# Patient Record
Sex: Male | Born: 1960
Health system: Southern US, Community
[De-identification: ages and names within clinical notes are randomized; demographics above are authoritative.]

## PROBLEM LIST (undated history)

## (undated) DIAGNOSIS — E669 Obesity, unspecified: Secondary | ICD-10-CM

## (undated) DIAGNOSIS — F1910 Other psychoactive substance abuse, uncomplicated: Secondary | ICD-10-CM

## (undated) DIAGNOSIS — E785 Hyperlipidemia, unspecified: Secondary | ICD-10-CM

## (undated) DIAGNOSIS — F1011 Alcohol abuse, in remission: Secondary | ICD-10-CM

## (undated) DIAGNOSIS — M109 Gout, unspecified: Secondary | ICD-10-CM

## (undated) DIAGNOSIS — E119 Type 2 diabetes mellitus without complications: Secondary | ICD-10-CM

## (undated) DIAGNOSIS — Z8489 Family history of other specified conditions: Secondary | ICD-10-CM

## (undated) DIAGNOSIS — Z87891 Personal history of nicotine dependence: Secondary | ICD-10-CM

## (undated) HISTORY — PX: COLONOSCOPY: SHX174

## (undated) HISTORY — DX: Type 2 diabetes mellitus without complications: E11.9

## (undated) HISTORY — DX: Obesity, unspecified: E66.9

## (undated) HISTORY — DX: Hyperlipidemia, unspecified: E78.5

## (undated) HISTORY — DX: Gout, unspecified: M10.9

## (undated) HISTORY — DX: Family history of other specified conditions: Z84.89

## (undated) HISTORY — PX: UMBILICAL HERNIA REPAIR: SHX196

## (undated) HISTORY — DX: Personal history of nicotine dependence: Z87.891

## (undated) HISTORY — DX: Alcohol abuse, in remission: F10.11

## (undated) HISTORY — DX: Other psychoactive substance abuse, uncomplicated: F19.10

---

## 2001-09-25 ENCOUNTER — Emergency Department (HOSPITAL_COMMUNITY): Admission: EM | Admit: 2001-09-25 | Discharge: 2001-09-25 | Payer: Self-pay | Admitting: Emergency Medicine

## 2001-12-19 ENCOUNTER — Emergency Department (HOSPITAL_COMMUNITY): Admission: EM | Admit: 2001-12-19 | Discharge: 2001-12-19 | Payer: Self-pay | Admitting: *Deleted

## 2004-04-22 ENCOUNTER — Emergency Department (HOSPITAL_COMMUNITY): Admission: EM | Admit: 2004-04-22 | Discharge: 2004-04-22 | Payer: Self-pay | Admitting: Emergency Medicine

## 2005-06-04 ENCOUNTER — Emergency Department (HOSPITAL_COMMUNITY): Admission: EM | Admit: 2005-06-04 | Discharge: 2005-06-04 | Payer: Self-pay | Admitting: Emergency Medicine

## 2005-08-05 ENCOUNTER — Emergency Department (HOSPITAL_COMMUNITY): Admission: EM | Admit: 2005-08-05 | Discharge: 2005-08-05 | Payer: Self-pay | Admitting: Emergency Medicine

## 2006-02-06 ENCOUNTER — Emergency Department (HOSPITAL_COMMUNITY): Admission: EM | Admit: 2006-02-06 | Discharge: 2006-02-06 | Payer: Self-pay | Admitting: Emergency Medicine

## 2006-02-15 ENCOUNTER — Emergency Department (HOSPITAL_COMMUNITY): Admission: EM | Admit: 2006-02-15 | Discharge: 2006-02-15 | Payer: Self-pay | Admitting: Emergency Medicine

## 2006-11-12 ENCOUNTER — Emergency Department (HOSPITAL_COMMUNITY): Admission: EM | Admit: 2006-11-12 | Discharge: 2006-11-12 | Payer: Self-pay | Admitting: Emergency Medicine

## 2007-06-11 ENCOUNTER — Emergency Department (HOSPITAL_COMMUNITY): Admission: EM | Admit: 2007-06-11 | Discharge: 2007-06-11 | Payer: Self-pay | Admitting: Emergency Medicine

## 2007-08-26 IMAGING — CR DG WRIST COMPLETE 3+V*R*
2 series · 2 of 2 positions shown · non-contrast
Comparison: none

HISTORY: Pain right foot, right hand, neck

RIGHT WRIST 4 VIEWS:
No fracture, dislocation, or bone destruction.
Joint spaces preserved.  
Mineralization normal.
Minimal soft tissue swelling dorsal to distal radial and ulnar metaphyses,
nonspecific.

[view not recorded (1 of 2)]
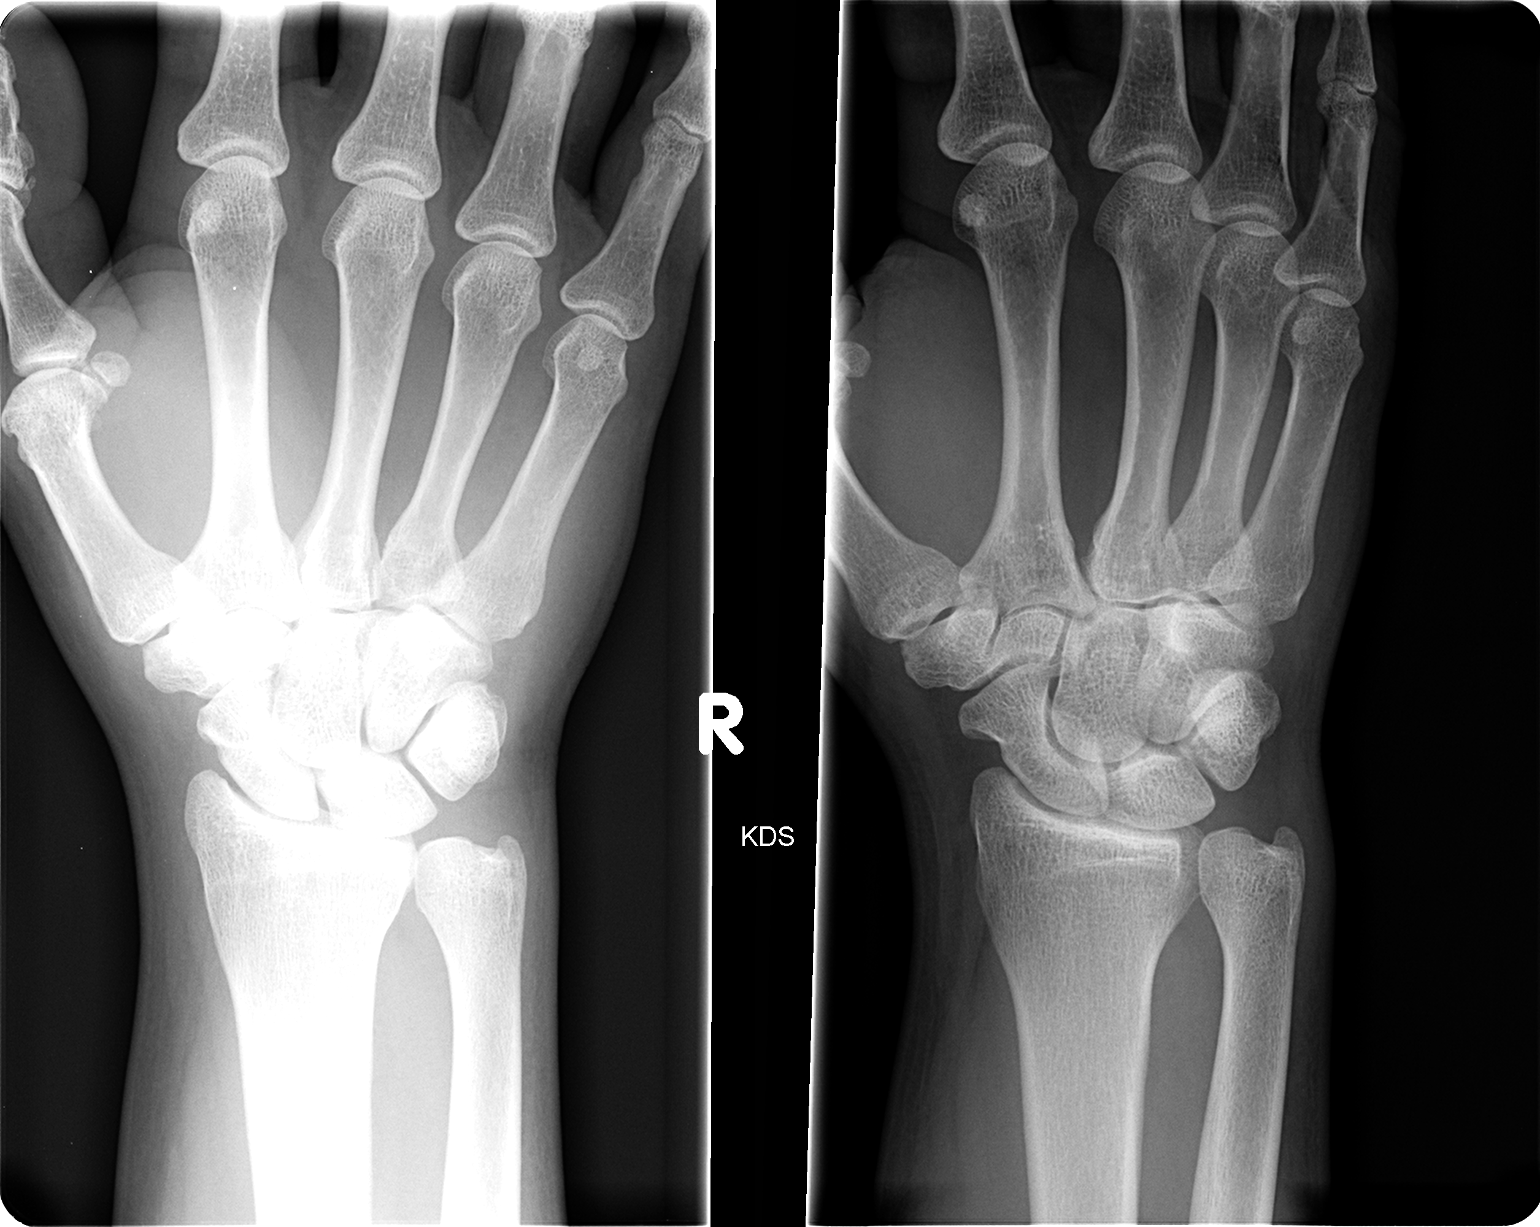

[view not recorded (2 of 2)]
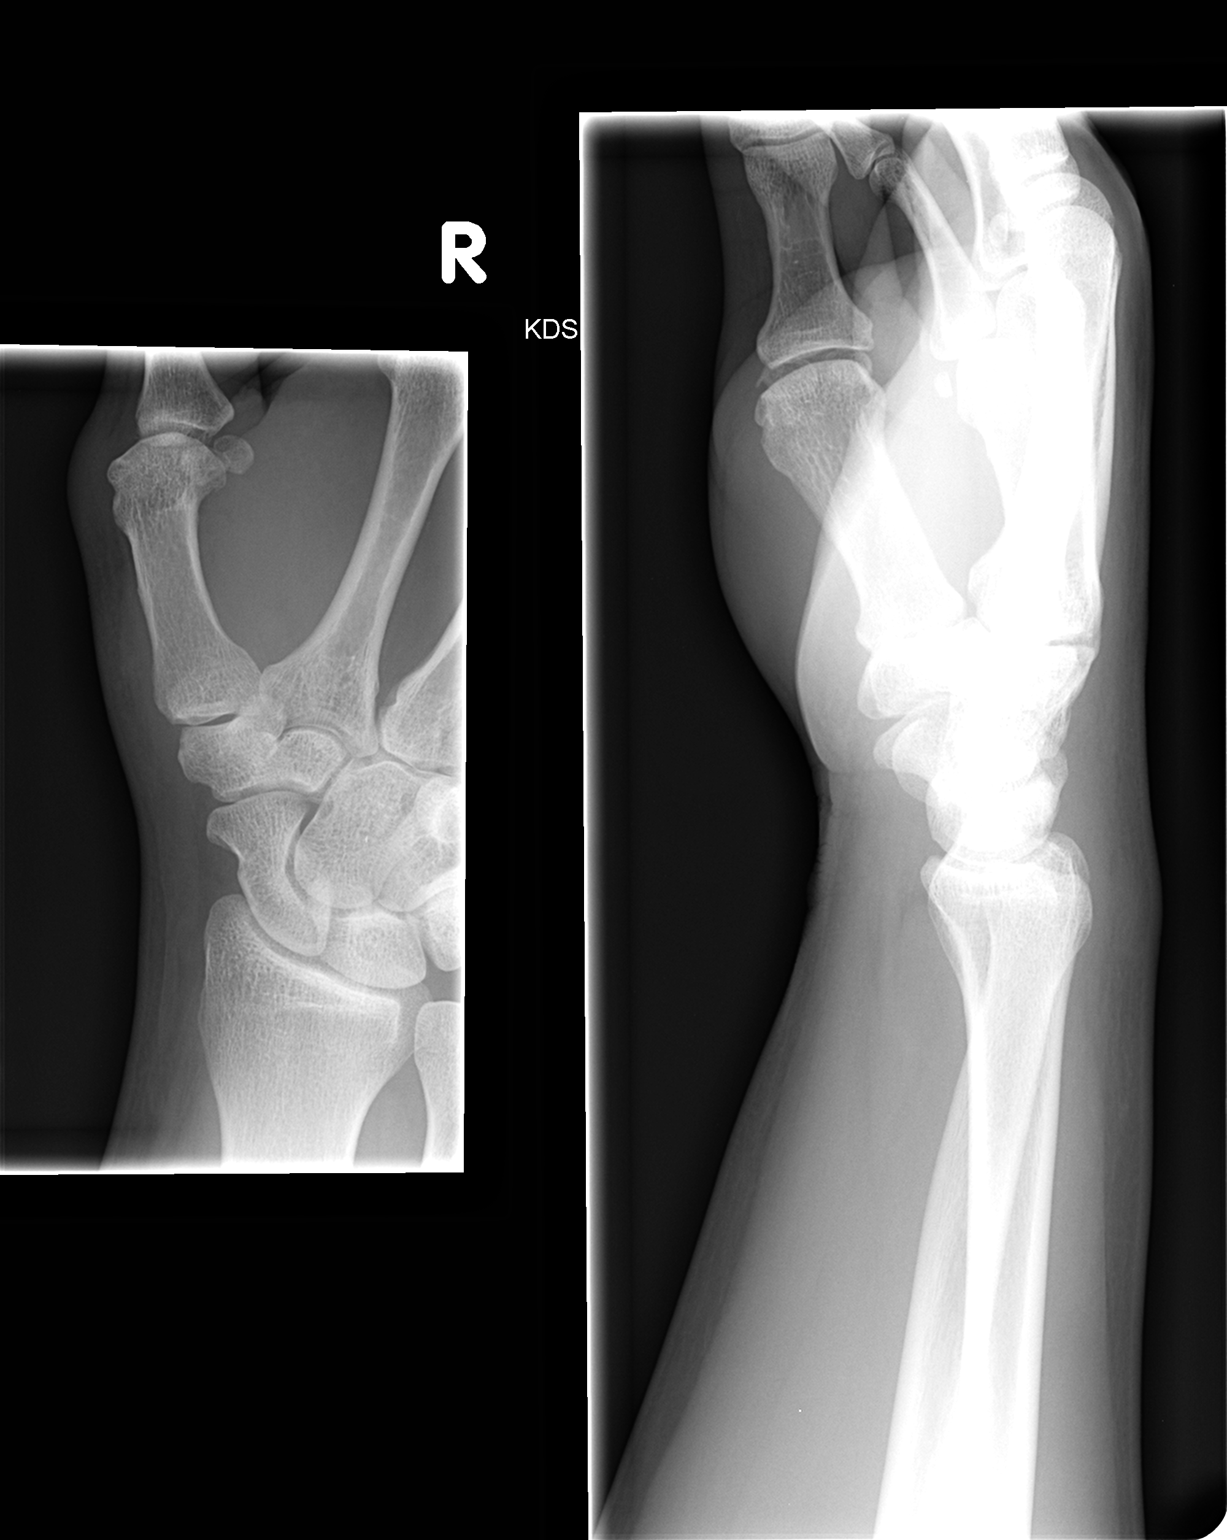

[2 of 2 positions shown; findings below may reference images not displayed]

IMPRESSION: No acute bony abnormalities.
Minimal dorsal soft tissue swelling at wrist.

RIGHT HAND 3 VIEWS:

Tiny ossicle at radial margin of first MCP joint.
No fracture, dislocation, or bone destruction.
Mineralization normal.
Question minimal soft tissue swelling at dorsal margin of hand at fifth MCP
joint.
IMPRESSION: No acute bony abnormalities.
Question minimal soft tissue swelling overlying 5th MCP joint.

RIGHT FOOT 3 VIEWS:

Mild degenerative changes first MTP joint with joint space narrowing.
Remaining joint spaces preserved.
Mineralization normal.
No fracture, dislocation, or bone destruction.
IMPRESSION: Degenerative changes right first MTP joint.

## 2007-08-26 IMAGING — CR DG HAND COMPLETE 3+V*R*
3 series · 3 of 3 positions shown · non-contrast
Comparison: none

HISTORY: Pain right foot, right hand, neck

RIGHT WRIST 4 VIEWS:
No fracture, dislocation, or bone destruction.
Joint spaces preserved.  
Mineralization normal.
Minimal soft tissue swelling dorsal to distal radial and ulnar metaphyses,
nonspecific.

[view not recorded (1 of 3)]
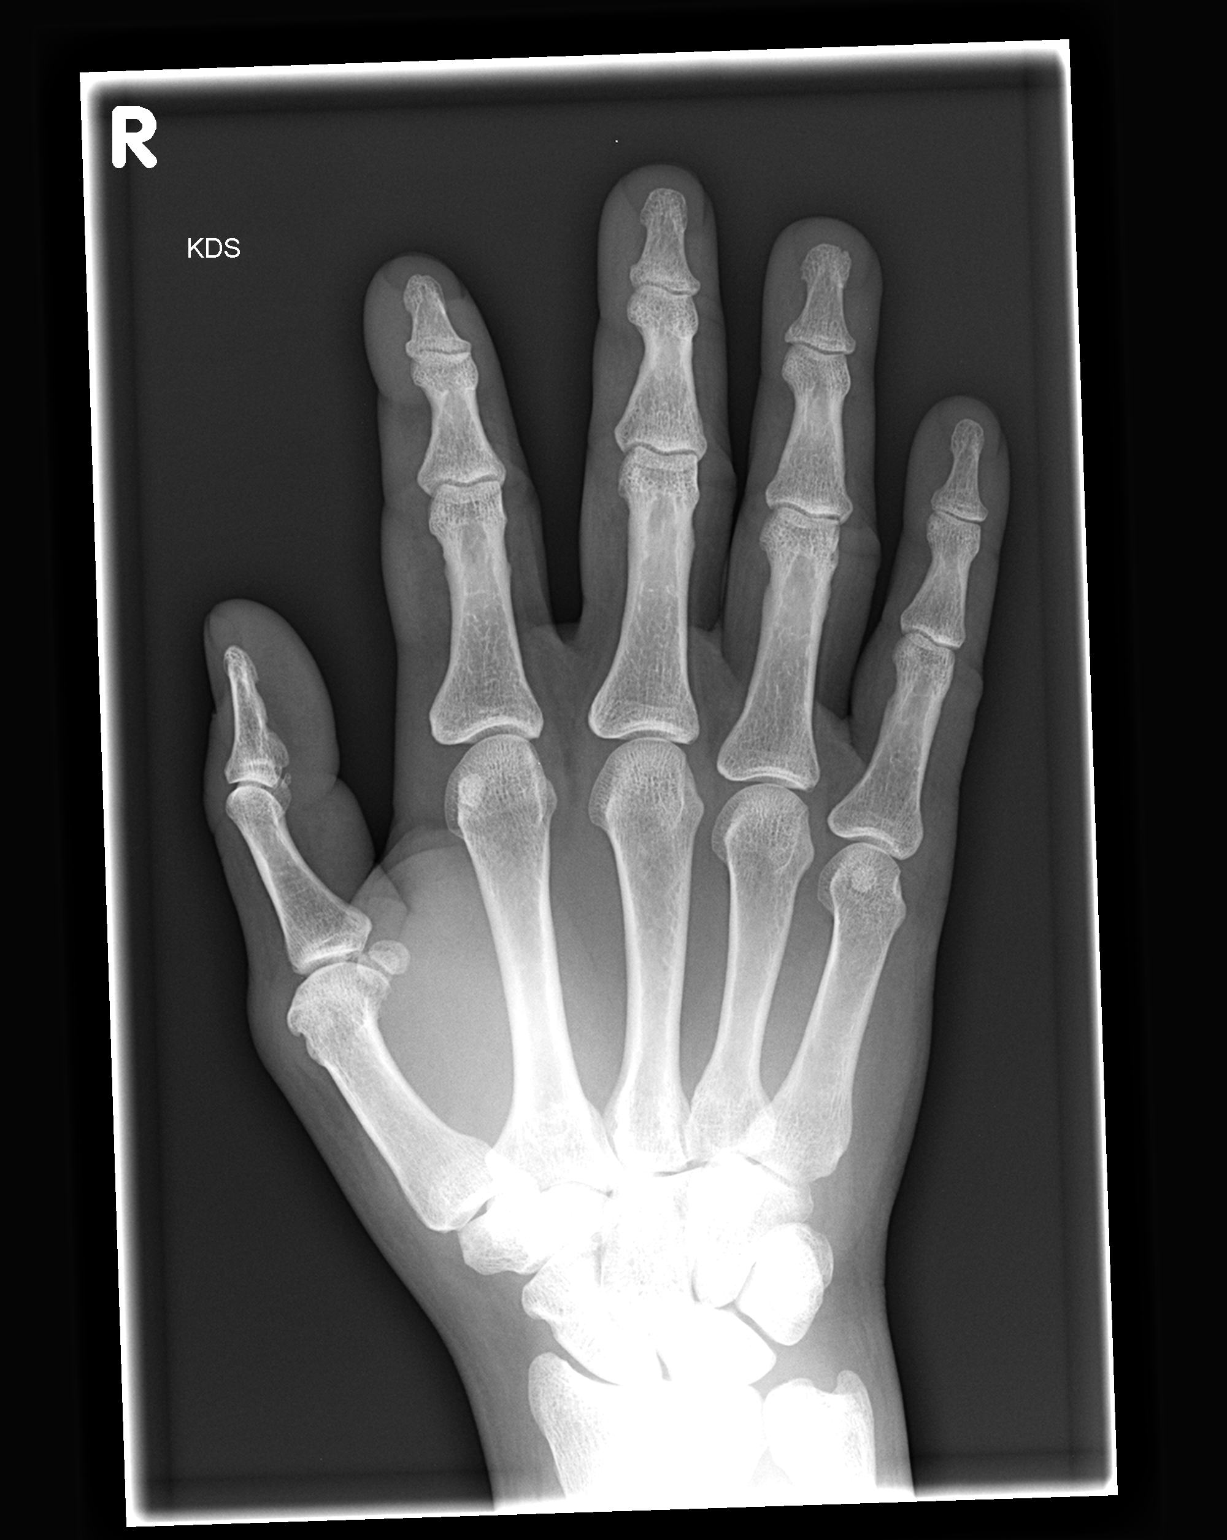

[view not recorded (2 of 3)]
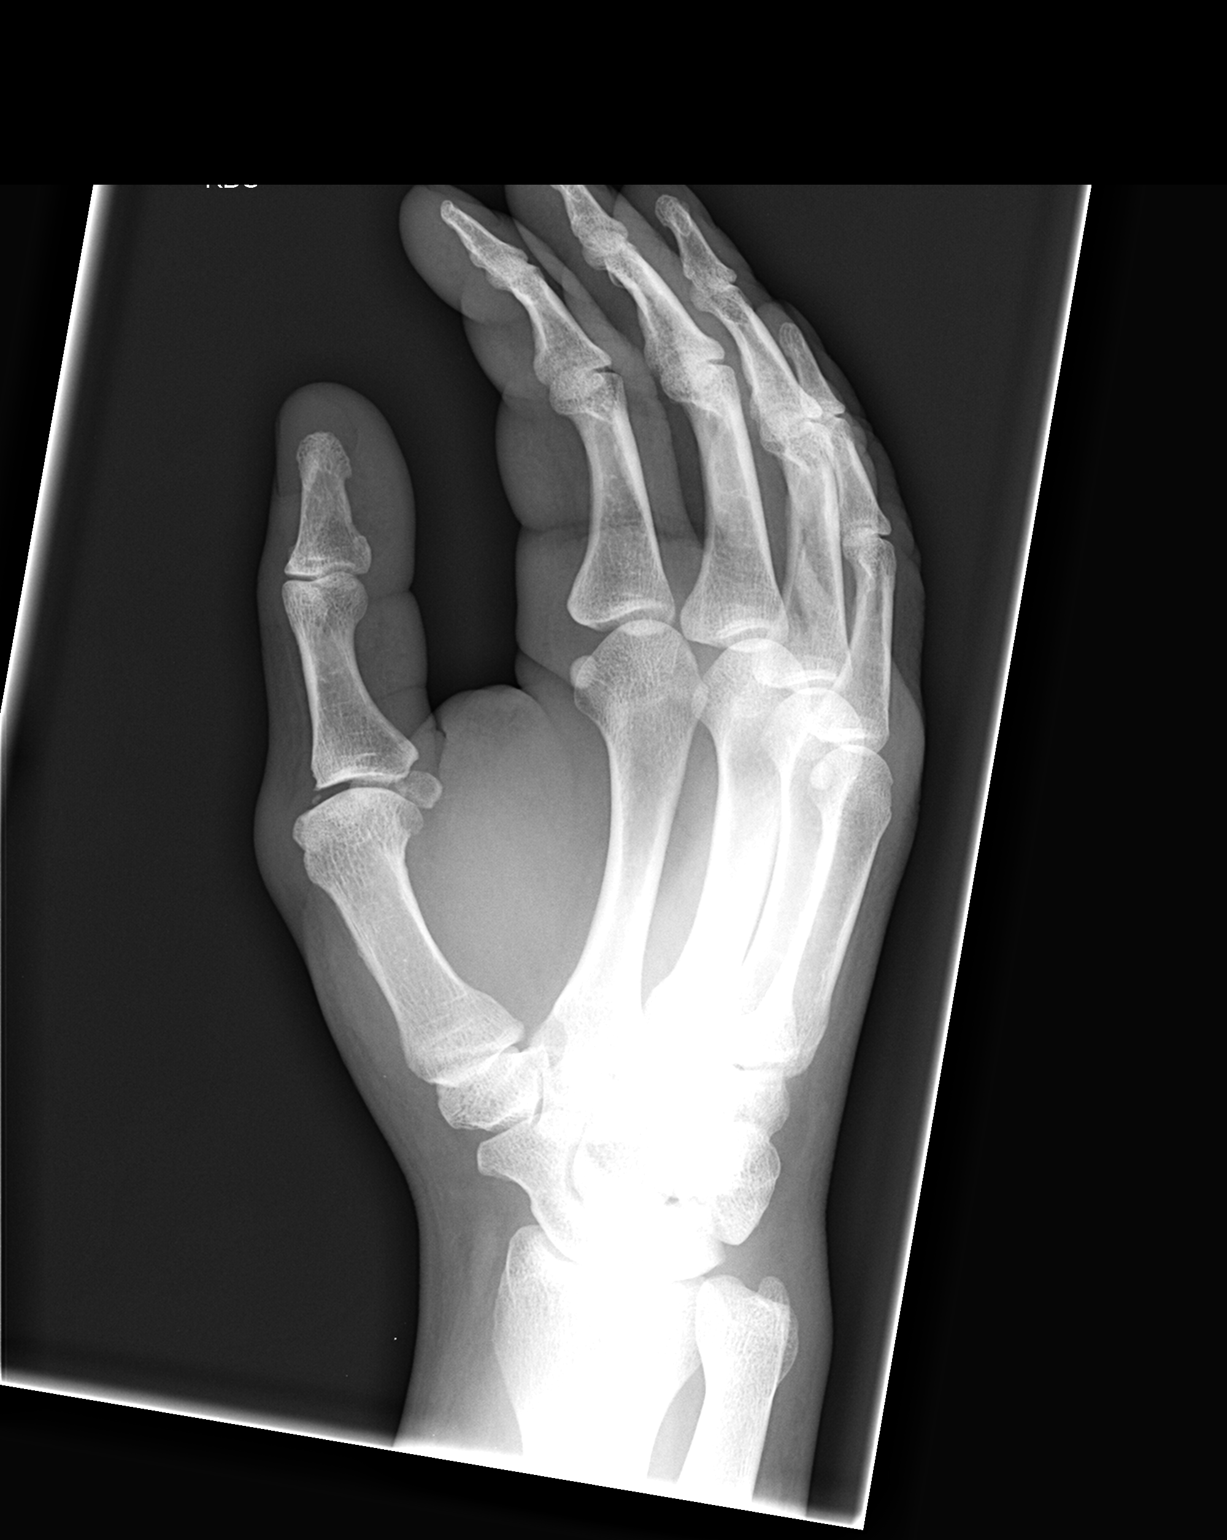

[view not recorded (3 of 3)]
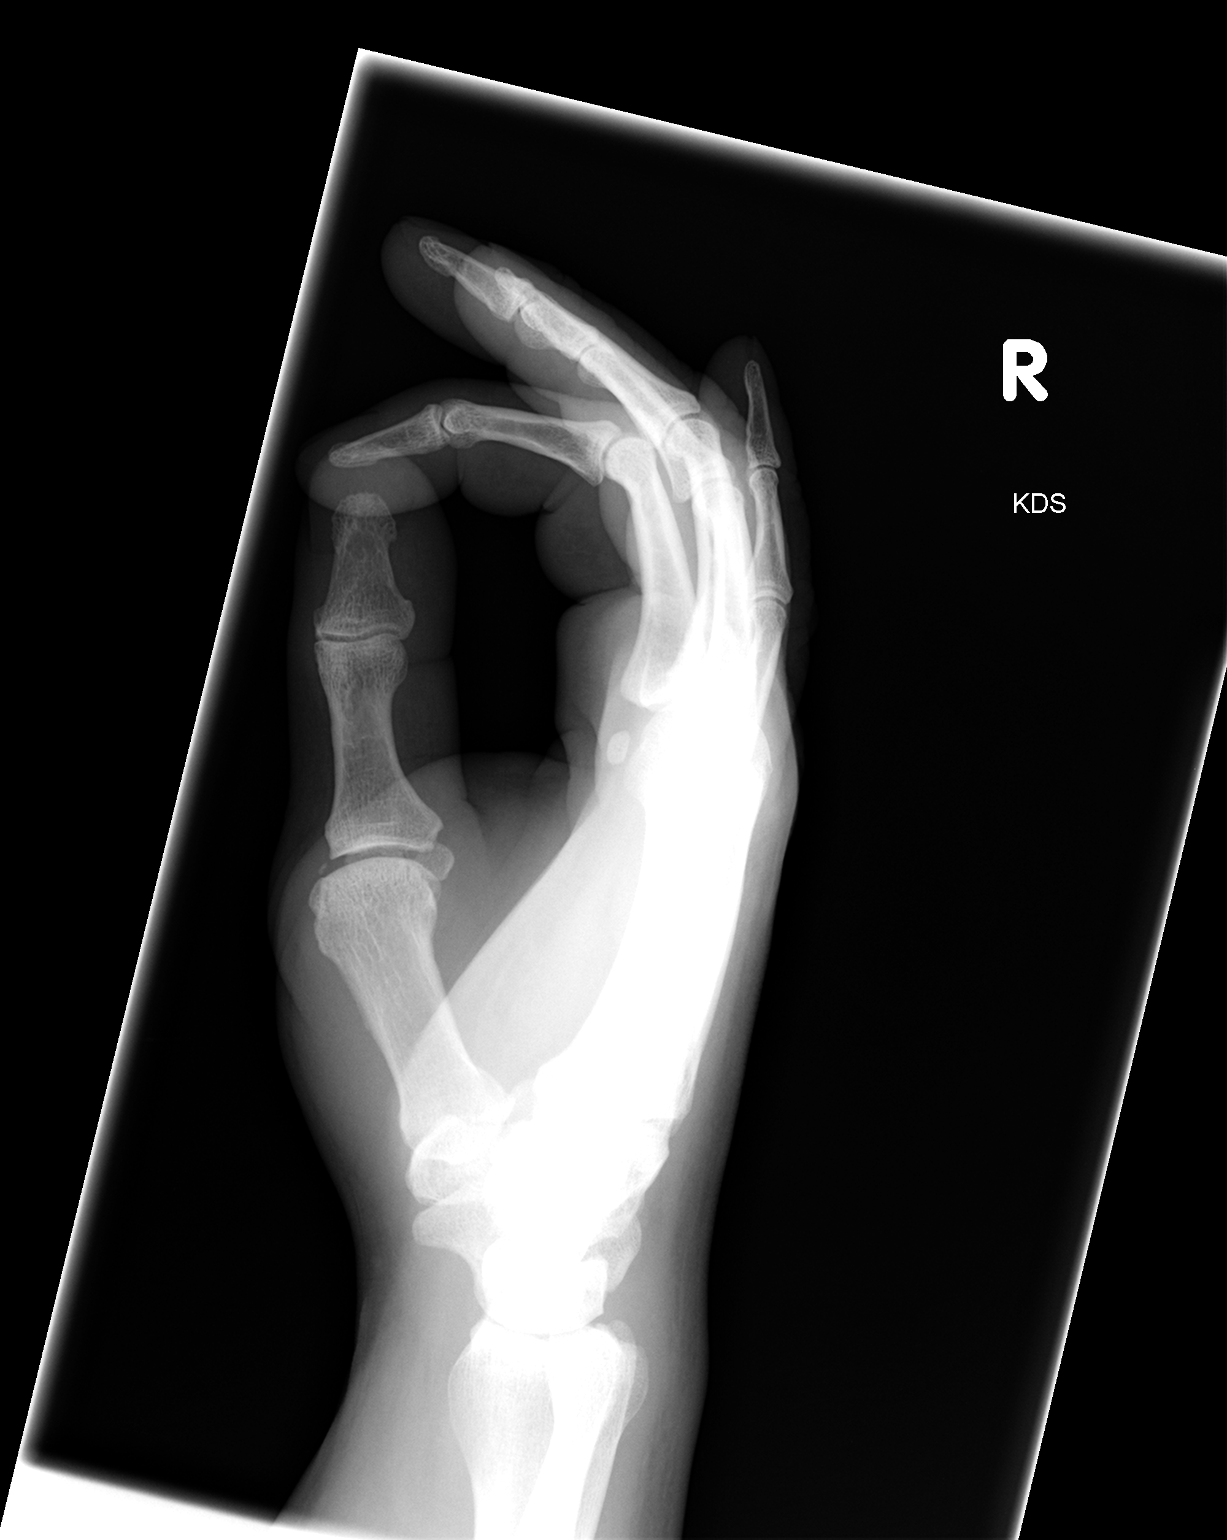

[3 of 3 positions shown; findings below may reference images not displayed]

IMPRESSION: No acute bony abnormalities.
Minimal dorsal soft tissue swelling at wrist.

RIGHT HAND 3 VIEWS:

Tiny ossicle at radial margin of first MCP joint.
No fracture, dislocation, or bone destruction.
Mineralization normal.
Question minimal soft tissue swelling at dorsal margin of hand at fifth MCP
joint.
IMPRESSION: No acute bony abnormalities.
Question minimal soft tissue swelling overlying 5th MCP joint.

RIGHT FOOT 3 VIEWS:

Mild degenerative changes first MTP joint with joint space narrowing.
Remaining joint spaces preserved.
Mineralization normal.
No fracture, dislocation, or bone destruction.
IMPRESSION: Degenerative changes right first MTP joint.

## 2008-02-20 ENCOUNTER — Ambulatory Visit: Payer: Self-pay | Admitting: Internal Medicine

## 2008-02-23 ENCOUNTER — Ambulatory Visit (HOSPITAL_COMMUNITY): Admission: RE | Admit: 2008-02-23 | Discharge: 2008-02-23 | Payer: Self-pay | Admitting: Internal Medicine

## 2008-03-14 ENCOUNTER — Ambulatory Visit (HOSPITAL_COMMUNITY): Admission: RE | Admit: 2008-03-14 | Discharge: 2008-03-14 | Payer: Self-pay | Admitting: Internal Medicine

## 2008-03-18 ENCOUNTER — Emergency Department (HOSPITAL_COMMUNITY): Admission: EM | Admit: 2008-03-18 | Discharge: 2008-03-18 | Payer: Self-pay | Admitting: Emergency Medicine

## 2008-07-04 ENCOUNTER — Ambulatory Visit: Payer: Self-pay | Admitting: Internal Medicine

## 2008-07-04 LAB — CONVERTED CEMR LAB
BUN: 14 mg/dL (ref 6–23)
CO2: 23 meq/L (ref 19–32)
Calcium: 9.6 mg/dL (ref 8.4–10.5)
Chloride: 103 meq/L (ref 96–112)
Cholesterol: 281 mg/dL — ABNORMAL HIGH (ref 0–200)
LDL Cholesterol: 186 mg/dL — ABNORMAL HIGH (ref 0–99)
Potassium: 4.4 meq/L (ref 3.5–5.3)
TSH: 2.103 microintl units/mL (ref 0.350–4.50)
Total CHOL/HDL Ratio: 6.4
Triglycerides: 253 mg/dL — ABNORMAL HIGH (ref <150)

## 2008-09-05 ENCOUNTER — Ambulatory Visit: Payer: Self-pay | Admitting: Internal Medicine

## 2008-09-16 ENCOUNTER — Ambulatory Visit (HOSPITAL_COMMUNITY): Admission: RE | Admit: 2008-09-16 | Discharge: 2008-09-16 | Payer: Self-pay | Admitting: General Surgery

## 2008-09-16 ENCOUNTER — Encounter: Admission: RE | Admit: 2008-09-16 | Discharge: 2008-09-16 | Payer: Self-pay | Admitting: General Surgery

## 2008-09-19 ENCOUNTER — Ambulatory Visit (HOSPITAL_COMMUNITY): Admission: RE | Admit: 2008-09-19 | Discharge: 2008-09-19 | Payer: Self-pay | Admitting: General Surgery

## 2008-09-26 ENCOUNTER — Ambulatory Visit: Payer: Self-pay | Admitting: Internal Medicine

## 2008-11-05 ENCOUNTER — Emergency Department (HOSPITAL_COMMUNITY): Admission: EM | Admit: 2008-11-05 | Discharge: 2008-11-05 | Payer: Self-pay | Admitting: Family Medicine

## 2008-12-24 ENCOUNTER — Emergency Department (HOSPITAL_COMMUNITY): Admission: EM | Admit: 2008-12-24 | Discharge: 2008-12-24 | Payer: Self-pay | Admitting: Emergency Medicine

## 2008-12-26 ENCOUNTER — Emergency Department (HOSPITAL_COMMUNITY): Admission: EM | Admit: 2008-12-26 | Discharge: 2008-12-26 | Payer: Self-pay | Admitting: Emergency Medicine

## 2009-02-27 ENCOUNTER — Ambulatory Visit: Payer: Self-pay | Admitting: Internal Medicine

## 2009-10-21 ENCOUNTER — Emergency Department (HOSPITAL_COMMUNITY): Admission: EM | Admit: 2009-10-21 | Discharge: 2009-10-21 | Payer: Self-pay | Admitting: Emergency Medicine

## 2009-12-11 ENCOUNTER — Observation Stay (HOSPITAL_COMMUNITY): Admission: EM | Admit: 2009-12-11 | Discharge: 2009-12-12 | Payer: Self-pay | Admitting: Emergency Medicine

## 2010-03-27 IMAGING — CR DG CHEST 2V
2 series · 2 of 2 positions shown · non-contrast
Comparison: None

CLINICAL DATA: Preop respiratory exam.  Umbilical hernia.

CHEST - 2 VIEW

[view not recorded (1 of 2)]
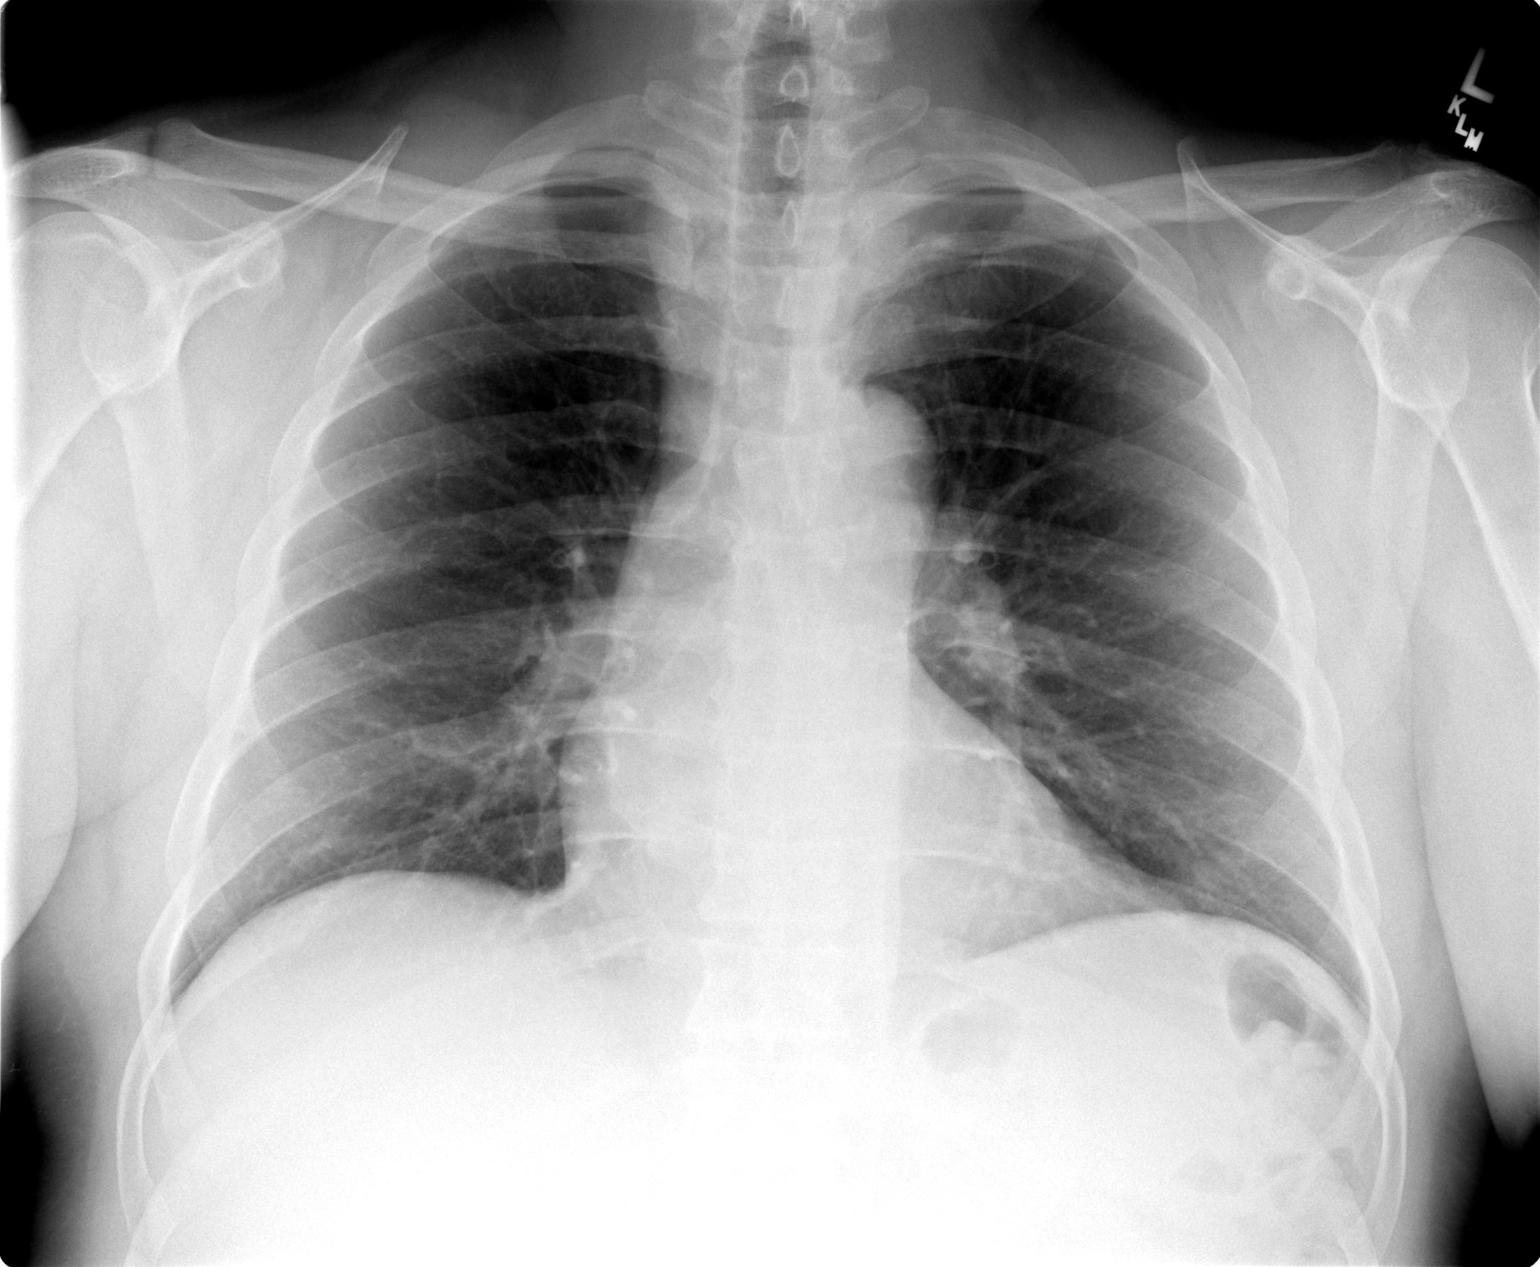

[view not recorded (2 of 2)]
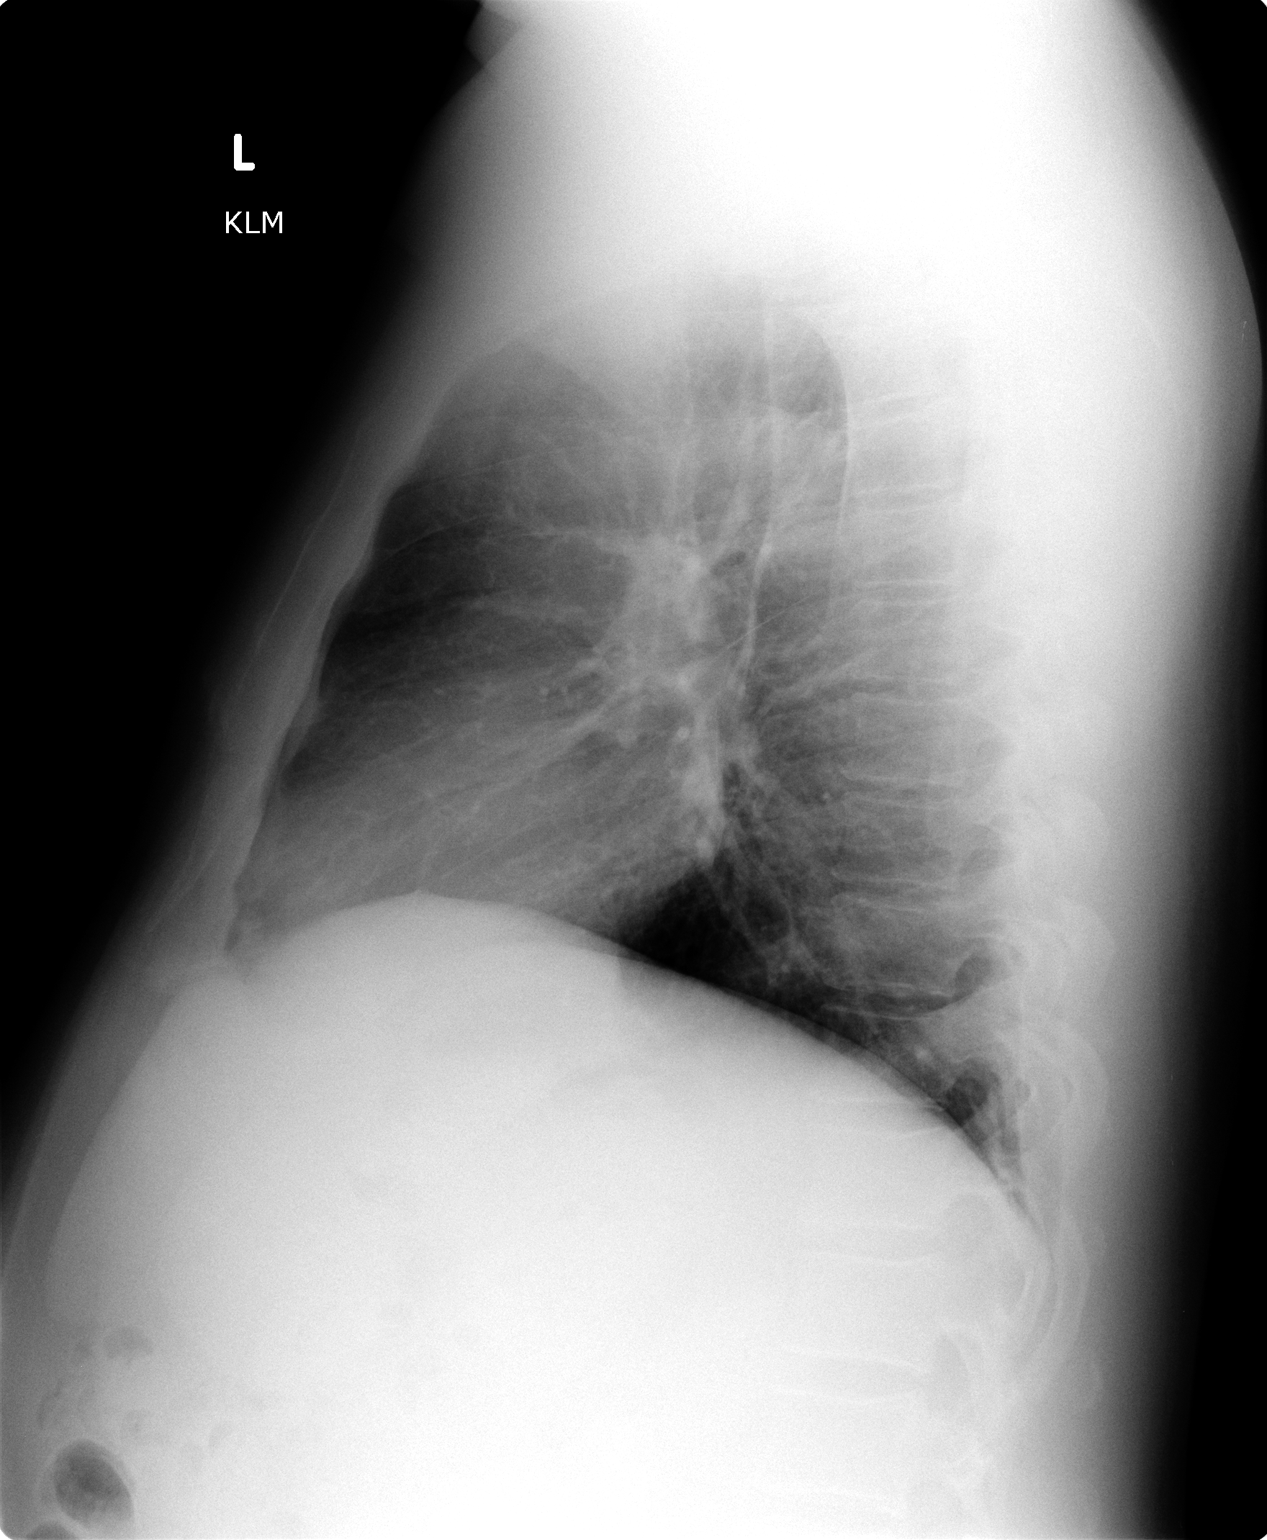

[2 of 2 positions shown; findings below may reference images not displayed]

FINDINGS: The heart size and mediastinal contours are within normal
limits.  Both lungs are clear.  The visualized skeletal structures
are unremarkable.
IMPRESSION: No active cardiopulmonary disease.

## 2010-08-15 LAB — CARDIAC PANEL(CRET KIN+CKTOT+MB+TROPI)
CK, MB: 1.5 ng/mL (ref 0.3–4.0)
Total CK: 151 U/L (ref 7–232)

## 2010-08-15 LAB — COMPREHENSIVE METABOLIC PANEL
ALT: 16 U/L (ref 0–53)
AST: 16 U/L (ref 0–37)
Albumin: 3.4 g/dL — ABNORMAL LOW (ref 3.5–5.2)
Alkaline Phosphatase: 36 U/L — ABNORMAL LOW (ref 39–117)
CO2: 29 mEq/L (ref 19–32)
Creatinine, Ser: 1.12 mg/dL (ref 0.4–1.5)
GFR calc Af Amer: >60 mL/min (ref >60)
GFR calc non Af Amer: >60 mL/min (ref >60)
Glucose, Bld: 132 mg/dL — ABNORMAL HIGH (ref 70–99)
Sodium: 142 mEq/L (ref 135–145)
Total Bilirubin: 0.5 mg/dL (ref 0.3–1.2)

## 2010-08-15 LAB — LIPID PANEL
Cholesterol: 215 mg/dL — ABNORMAL HIGH (ref 0–200)
HDL: 32 mg/dL — ABNORMAL LOW (ref >39)
LDL Cholesterol: 138 mg/dL — ABNORMAL HIGH (ref 0–99)
Triglycerides: 223 mg/dL — ABNORMAL HIGH (ref <150)

## 2010-08-15 LAB — CBC
HCT: 36.8 % — ABNORMAL LOW (ref 39.0–52.0)
Hemoglobin: 12.4 g/dL — ABNORMAL LOW (ref 13.0–17.0)
MCHC: 33.6 g/dL (ref 30.0–36.0)
Platelets: 169 10*3/uL (ref 150–400)
RBC: 3.97 MIL/uL — ABNORMAL LOW (ref 4.22–5.81)
RDW: 14.4 % (ref 11.5–15.5)

## 2010-08-16 LAB — BASIC METABOLIC PANEL
BUN: 17 mg/dL (ref 6–23)
Calcium: 9 mg/dL (ref 8.4–10.5)
Potassium: 3.8 mEq/L (ref 3.5–5.1)

## 2010-08-16 LAB — POCT CARDIAC MARKERS
CKMB, poc: <1 ng/mL — ABNORMAL LOW (ref 1.0–8.0)
Myoglobin, poc: 76.4 ng/mL (ref 12–200)
Myoglobin, poc: 87.8 ng/mL (ref 12–200)
Troponin i, poc: <0.05 ng/mL (ref 0.00–0.09)
Troponin i, poc: <0.05 ng/mL (ref 0.00–0.09)

## 2010-08-16 LAB — DIFFERENTIAL
Basophils Absolute: 0 10*3/uL (ref 0.0–0.1)
Eosinophils Relative: 2 % (ref 0–5)
Monocytes Absolute: 0.4 10*3/uL (ref 0.1–1.0)
Neutro Abs: 3.6 10*3/uL (ref 1.7–7.7)
Neutrophils Relative %: 54 % (ref 43–77)

## 2010-08-16 LAB — CBC
HCT: 39.1 % (ref 39.0–52.0)
MCH: 31.4 pg (ref 26.0–34.0)
RBC: 4.25 MIL/uL (ref 4.22–5.81)
RDW: 14.6 % (ref 11.5–15.5)

## 2010-08-16 LAB — D-DIMER, QUANTITATIVE: D-Dimer, Quant: 0.29 ug/mL-FEU (ref 0.00–0.48)

## 2010-09-06 LAB — CULTURE, ROUTINE-ABSCESS

## 2010-09-09 LAB — DIFFERENTIAL
Basophils Absolute: 0 10*3/uL (ref 0.0–0.1)
Eosinophils Absolute: 0.1 10*3/uL (ref 0.0–0.7)
Lymphocytes Relative: 35 % (ref 12–46)
Lymphs Abs: 2.3 10*3/uL (ref 0.7–4.0)
Neutrophils Relative %: 59 % (ref 43–77)

## 2010-09-09 LAB — CBC
HCT: 40.9 % (ref 39.0–52.0)
MCHC: 34 g/dL (ref 30.0–36.0)
MCV: 90.7 fL (ref 78.0–100.0)
Platelets: 176 10*3/uL (ref 150–400)
WBC: 6.6 10*3/uL (ref 4.0–10.5)

## 2010-09-09 LAB — COMPREHENSIVE METABOLIC PANEL
ALT: 15 U/L (ref 0–53)
Albumin: 3.8 g/dL (ref 3.5–5.2)
Alkaline Phosphatase: 45 U/L (ref 39–117)
Calcium: 9.1 mg/dL (ref 8.4–10.5)
GFR calc non Af Amer: >60 mL/min (ref >60)
Glucose, Bld: 130 mg/dL — ABNORMAL HIGH (ref 70–99)
Potassium: 4.1 mEq/L (ref 3.5–5.1)
Sodium: 139 mEq/L (ref 135–145)
Total Bilirubin: 0.5 mg/dL (ref 0.3–1.2)
Total Protein: 7 g/dL (ref 6.0–8.3)

## 2010-10-13 NOTE — Op Note (Signed)
Daniel Stevens, Daniel Stevens                 ACCOUNT NO.:  [id number]   MEDICAL RECORD NO.:  [id number]          PATIENT TYPE:  AMB   LOCATION:  SDS                          FACILITY:  [facility]   PHYSICIAN:  Cherylynn Ridges, M.D.    DATE OF BIRTH:  [birth year]   DATE OF PROCEDURE:  09/19/2008  DATE OF DISCHARGE:  09/19/2008                               OPERATIVE REPORT   PREOPERATIVE DIAGNOSIS:  Umbilical hernia.   POSTOPERATIVE DIAGNOSIS:  A 2-cm supraumbilical ventral hernia.   PROCEDURE:  Repair, primary of supraumbilical ventral hernia.   SURGEON:  Marta Lamas. Lindie Spruce, MD   ANESTHESIA:  General with a laryngeal airway.   ESTIMATED BLOOD LOSS:  Less than 20 mL.   COMPLICATIONS:  None.   CONDITION:  Stable.   FINDINGS:  A 2-cm supraumbilical hernia defect with incarcerated  omentum.   INDICATION FOR PROCEDURE:  The patient is a 50 year old with a  symptomatic supraumbilical ventral hernia who now comes in for repair.   OPERATION:  The patient was taken to the operating room, placed on table  in supine position.  After an adequate general and endotracheal  anesthetic was administered, he was prepped and draped in the usual  sterile manner exposing the midline.   A supraumbilical midline incision was made about 5 cm long.  It was  taken down into the subcu where we directly encountered the hernia  defect.   We dissected off the hernia sac down to its fascial attachments and then  dissected into the sac at that level.  We resected the sac and omentum  using Kelly clamps and 3-0 Vicryl ties at the base.  The remaining  omentum fell back into the preperitoneal space and peritoneal cavity.  We then freed up the edges of the fascia circumferentially, internally,  and externally and then subsequently repaired the hernia defect  primarily using interrupted simple and figure-of-eight stitches of #1  Novofil followed by a running back and forth stitch of #1 Novofil.  Once  this was done, we  irrigated with saline.  We closed in 3 layers of deep  3-0 Vicryl layer, a more superficial 3-0 Vicryl layer  and then a running subcuticular stitch of 4-0 Monocryl.  Marcaine 0.5%  without epi was injected in for pain control.  Dermabond, Steri-Strips,  and Tegaderm were used as a final dressing.  All needles counts, sponge  counts, and instrument counts were correct.      Cherylynn Ridges, M.D.  Electronically Signed     JOW/MEDQ  D:  09/19/2008  T:  09/20/2008  Job:  [id number]

## 2011-03-18 LAB — BASIC METABOLIC PANEL
BUN: 6
Chloride: 104
Creatinine, Ser: 0.92
GFR calc Af Amer: >60
GFR calc non Af Amer: >60

## 2011-03-18 LAB — CBC
MCV: 90.5
Platelets: 194
RBC: 4.8
WBC: 8

## 2011-03-18 LAB — URINALYSIS, ROUTINE W REFLEX MICROSCOPIC
Hgb urine dipstick: NEGATIVE
Protein, ur: NEGATIVE
Urobilinogen, UA: 1
pH: 6

## 2011-03-18 LAB — ETHANOL: Alcohol, Ethyl (B): <5

## 2011-03-18 LAB — DIFFERENTIAL
Eosinophils Absolute: 0.1
Lymphocytes Relative: 27
Lymphs Abs: 2.2
Monocytes Relative: 5
Neutro Abs: 5.2
Neutrophils Relative %: 66

## 2011-03-18 LAB — RAPID URINE DRUG SCREEN, HOSP PERFORMED
Benzodiazepines: NOT DETECTED
Cocaine: POSITIVE — AB
Tetrahydrocannabinol: NOT DETECTED

## 2011-07-09 ENCOUNTER — Emergency Department (HOSPITAL_COMMUNITY)
Admission: EM | Admit: 2011-07-09 | Discharge: 2011-07-09 | Disposition: A | Discharge status: Home / Self Care | Payer: BC Managed Care – PPO | Attending: Emergency Medicine | Admitting: Emergency Medicine

## 2011-07-09 ENCOUNTER — Emergency Department (HOSPITAL_COMMUNITY): Payer: BC Managed Care – PPO

## 2011-07-09 ENCOUNTER — Encounter (HOSPITAL_COMMUNITY): Payer: Self-pay

## 2011-07-09 DIAGNOSIS — M25569 Pain in unspecified knee: Secondary | ICD-10-CM | POA: Insufficient documentation

## 2011-07-09 DIAGNOSIS — M25461 Effusion, right knee: Secondary | ICD-10-CM

## 2011-07-09 DIAGNOSIS — M25469 Effusion, unspecified knee: Secondary | ICD-10-CM | POA: Insufficient documentation

## 2011-07-09 DIAGNOSIS — F17200 Nicotine dependence, unspecified, uncomplicated: Secondary | ICD-10-CM | POA: Insufficient documentation

## 2011-07-09 MED ORDER — HYDROCODONE-ACETAMINOPHEN 5-325 MG PO TABS: ORAL_TABLET | ORAL | Status: DC

## 2011-07-09 MED ORDER — IBUPROFEN 600 MG PO TABS: 600.0000 mg | ORAL_TABLET | Freq: Four times a day (QID) | ORAL | Status: AC | PRN

## 2011-07-09 NOTE — ED Provider Notes (Signed)
History     CSN: [id number]  Arrival date & time 07/09/11  0747   First MD Initiated Contact with Patient 07/09/11 0805      Chief Complaint  Patient presents with  . Knee Pain    (Consider location/radiation/quality/duration/timing/severity/associated sxs/prior treatment) HPI Comments: Patient presents with a two-week history of increasing right knee pain with intermittent swelling. He denies any current injury, but does describe an old "twisting" injury more than 10 years ago, which he believes involved cartilage injury, although he had not been treated by orthopedics at that time. He does currently stand all day with his job, as he has been for the past 6 months. Pain is worse in the evening and often wakes him at night. Pain is improved with Motrin. Reports increasing popping and clicking in his knee with range of motion. He denies redness and increased warmth at the knee joint. He also describes numbness in his lateral lower thigh which also started about 2 weeks ago. He denies low back pain or weakness of his lower extremities.  Patient is a 51 y.o. male presenting with knee pain. The history is provided by the patient.  Knee Pain Associated symptoms include arthralgias and joint swelling. Pertinent negatives include no abdominal pain, chest pain, congestion, fever, headaches, nausea, neck pain, numbness, rash, sore throat or weakness.    History reviewed. No pertinent past medical history.  Past Surgical History  Procedure Date  . Hernia repair     History reviewed. No pertinent family history.  History  Substance Use Topics  . Smoking status: Current Everyday Smoker  . Smokeless tobacco: Not on file  . Alcohol Use: Yes     occasionally      Review of Systems  Constitutional: Negative for fever.  HENT: Negative for congestion, sore throat and neck pain.   Eyes: Negative.   Respiratory: Negative for chest tightness and shortness of breath.   Cardiovascular: Negative  for chest pain.  Gastrointestinal: Negative for nausea and abdominal pain.  Genitourinary: Negative.   Musculoskeletal: Positive for joint swelling and arthralgias.  Skin: Negative.  Negative for rash and wound.  Neurological: Negative for dizziness, weakness, light-headedness, numbness and headaches.  Hematological: Negative.   Psychiatric/Behavioral: Negative.     Allergies  Review of patient's allergies indicates no known allergies.  Home Medications   Current Outpatient Rx  Name Route Sig Dispense Refill  . IBUPROFEN 200 MG PO TABS Oral Take 200 mg by mouth every 6 (six) hours as needed.    Marland Kitchen HYDROCODONE-ACETAMINOPHEN 5-325 MG PO TABS  Take one tablet PO qhs prn pain 10 tablet 0  . IBUPROFEN 600 MG PO TABS Oral Take 1 tablet (600 mg total) by mouth every 6 (six) hours as needed for pain. 30 tablet 0    BP 135/93  Pulse 77  Temp(Src) 98.5 F (36.9 C) (Oral)  Resp 18  Ht 5\' 10"  (1.778 m)  Wt 220 lb (99.791 kg)  BMI 31.57 kg/m2  SpO2 99%  Physical Exam  Nursing note and vitals reviewed. Constitutional: He is oriented to person, place, and time. He appears well-developed and well-nourished.  HENT:  Head: Normocephalic and atraumatic.  Eyes: Conjunctivae are normal.  Neck: Normal range of motion.  Cardiovascular: Normal rate and intact distal pulses.  Exam reveals no decreased pulses.   Pulses:      Dorsalis pedis pulses are 2+ on the right side, and 2+ on the left side.       Posterior tibial pulses  are 2+ on the right side, and 2+ on the left side.  Pulmonary/Chest: Effort normal.  Abdominal: He exhibits no distension.  Musculoskeletal: Normal range of motion. He exhibits tenderness. He exhibits no edema.       Right knee: He exhibits bony tenderness. He exhibits normal range of motion, no effusion, no ecchymosis, no deformity, no erythema, normal alignment, no LCL laxity and normal patellar mobility. tenderness found. Medial joint line and lateral joint line tenderness  noted.       Right ankle: He exhibits normal pulse.       Moderate crepitus appreciated with range of motion of right knee. No effusion or edema noted at this time.  Neurological: He is alert and oriented to person, place, and time. No sensory deficit.  Skin: Skin is warm, dry and intact.  Psychiatric: He has a normal mood and affect.    ED Course  Procedures (including critical care time)  Labs Reviewed - No data to display Dg Knee Complete 4 Views Right  07/09/2011  *RADIOLOGY REPORT*  Clinical Data: Medial right knee pain  RIGHT KNEE - COMPLETE 4+ VIEW  Comparison: None.  Findings: No acute fracture and no dislocation. Small joint effusion.  IMPRESSION: No evidence of acute bony injury.  Small joint effusion.  Original Report Authenticated By: Donavan Burnet, M.D.     1. Knee effusion, right       MDM  Ace wrap applied, ibuprofen 600 mg every 6 hours, hydrocodone QHS when necessary. Referral to Dr. Hilda Lias for further management.        Candis Musa, PA 07/09/11 [phone]

## 2011-07-09 NOTE — ED Provider Notes (Signed)
Medical screening examination/treatment/procedure(s) were performed by non-physician practitioner and as supervising physician I was immediately available for consultation/collaboration.   Daven Pinckney W Dashon Mcintire, MD 07/09/11 1508 

## 2011-07-09 NOTE — ED Notes (Signed)
Pt c/o r knee pain x 2 weeks.  Denies injury.  Says hurts worse after standing all day.  Reports intermittent swelling.

## 2011-08-13 ENCOUNTER — Emergency Department (HOSPITAL_COMMUNITY)
Admission: EM | Admit: 2011-08-13 | Discharge: 2011-08-13 | Disposition: A | Discharge status: Home / Self Care | Payer: BC Managed Care – PPO | Attending: Emergency Medicine | Admitting: Emergency Medicine

## 2011-08-13 ENCOUNTER — Encounter (HOSPITAL_COMMUNITY): Payer: Self-pay | Admitting: Emergency Medicine

## 2011-08-13 DIAGNOSIS — F17200 Nicotine dependence, unspecified, uncomplicated: Secondary | ICD-10-CM | POA: Insufficient documentation

## 2011-08-13 DIAGNOSIS — M25561 Pain in right knee: Secondary | ICD-10-CM

## 2011-08-13 DIAGNOSIS — M25569 Pain in unspecified knee: Secondary | ICD-10-CM | POA: Insufficient documentation

## 2011-08-13 MED ORDER — HYDROCODONE-ACETAMINOPHEN 5-325 MG PO TABS: 1.0000 | ORAL_TABLET | ORAL | Status: AC | PRN

## 2011-08-13 NOTE — Progress Notes (Signed)
Orthopedic Tech Progress Note Patient Details:  Daniel Stevens 1960-09-19 [id number]  Other Ortho Devices Type of Ortho Device: Knee Sleeve Ortho Device Interventions: Application   Cammer, Mickie Bail 08/13/2011, 2:39 PM

## 2011-08-13 NOTE — ED Notes (Signed)
Spoke with jennifer in ortho. She will come apply knee sleeve

## 2011-08-13 NOTE — ED Notes (Signed)
woek up this am and c/o rt knee pain states was seen at [facility] for same a few weeks ago. No given brace or anything

## 2011-08-13 NOTE — Discharge Instructions (Signed)
Follow up with Dr Rennis Chris for further evaluation and determination of any necessary testing for your continued knee pain. If Dr. supple's office hours to not allow you to see him, you can visit the after hours orthopedic clinic with the [facility] on [facility]. Wear the knee sleeve for comfort and to keep swelling down. You can continue to take ibuprofen to reduce your pain and swelling. Apply ice to the knee at the end of each work day to reduce swelling. Please be aware that this may give you some residual stiffness which is perfectly normal.         Knee Pain The knee is the complex joint between your thigh and your lower leg. It is made up of bones, tendons, ligaments, and cartilage. The bones that make up the knee are:  The femur in the thigh.   The tibia and fibula in the lower leg.   The patella or kneecap riding in the groove on the lower femur.  CAUSES  Knee pain is a common complaint with many causes. A few of these causes are:  Injury, such as:   A ruptured ligament or tendon injury.   Torn cartilage.   Medical conditions, such as:   Gout   Arthritis   Infections   Overuse, over training or overdoing a physical activity.  Knee pain can be minor or severe. Knee pain can accompany debilitating injury. Minor knee problems often respond well to self-care measures or get well on their own. More serious injuries may need medical intervention or even surgery. SYMPTOMS The knee is complex. Symptoms of knee problems can vary widely. Some of the problems are:  Pain with movement and weight bearing.   Swelling and tenderness.   Buckling of the knee.   Inability to straighten or extend your knee.   Your knee locks and you cannot straighten it.   Warmth and redness with pain and fever.   Deformity or dislocation of the kneecap.  DIAGNOSIS  Determining what is wrong may be very straight forward such as when there is an injury. It can  also be challenging because of the complexity of the knee. Tests to make a diagnosis may include:  Your caregiver taking a history and doing a physical exam.   Routine X-rays can be used to rule out other problems. X-rays will not reveal a cartilage tear. Some injuries of the knee can be diagnosed by:   Arthroscopy a surgical technique by which a small video camera is inserted through tiny incisions on the sides of the knee. This procedure is used to examine and repair internal knee joint problems. Tiny instruments can be used during arthroscopy to repair the torn knee cartilage (meniscus).   Arthrography is a radiology technique. A contrast liquid is directly injected into the knee joint. Internal structures of the knee joint then become visible on X-ray film.   An MRI scan is a non x-ray radiology procedure in which magnetic fields and a computer produce two- or three-dimensional images of the inside of the knee. Cartilage tears are often visible using an MRI scanner. MRI scans have largely replaced arthrography in diagnosing cartilage tears of the knee.   Blood work.   Examination of the fluid that helps to lubricate the knee joint (synovial fluid). This is done by taking a sample out using a needle and a syringe.  TREATMENT The treatment of knee problems depends on the cause. Some of these treatments are:  Depending  on the injury, proper casting, splinting, surgery or physical therapy care will be needed.   Give yourself adequate recovery time. Do not overuse your joints. If you begin to get sore during workout routines, back off. Slow down or do fewer repetitions.   For repetitive activities such as cycling or running, maintain your strength and nutrition.   Alternate muscle groups. For example if you are a weight lifter, work the upper body on one day and the lower body the next.   Either tight or weak muscles do not give the proper support for your knee. Tight or weak muscles do not  absorb the stress placed on the knee joint. Keep the muscles surrounding the knee strong.   Take care of mechanical problems.   If you have flat feet, orthotics or special shoes may help. See your caregiver if you need help.   Arch supports, sometimes with wedges on the inner or outer aspect of the heel, can help. These can shift pressure away from the side of the knee most bothered by osteoarthritis.   A brace called an "unloader" brace also may be used to help ease the pressure on the most arthritic side of the knee.   If your caregiver has prescribed crutches, braces, wraps or ice, use as directed. The acronym for this is PRICE. This means protection, rest, ice, compression and elevation.   Nonsteroidal anti-inflammatory drugs (NSAID's), can help relieve pain. But if taken immediately after an injury, they may actually increase swelling. Take NSAID's with food in your stomach. Stop them if you develop stomach problems. Do not take these if you have a history of ulcers, stomach pain or bleeding from the bowel. Do not take without your caregiver's approval if you have problems with fluid retention, heart failure, or kidney problems.   For ongoing knee problems, physical therapy may be helpful.   Glucosamine and chondroitin are over-the-counter dietary supplements. Both may help relieve the pain of osteoarthritis in the knee. These medicines are different from the usual anti-inflammatory drugs. Glucosamine may decrease the rate of cartilage destruction.   Injections of a corticosteroid drug into your knee joint may help reduce the symptoms of an arthritis flare-up. They may provide pain relief that lasts a few months. You may have to wait a few months between injections. The injections do have a small increased risk of infection, water retention and elevated blood sugar levels.   Hyaluronic acid injected into damaged joints may ease pain and provide lubrication. These injections may work by  reducing inflammation. A series of shots may give relief for as long as 6 months.   Topical painkillers. Applying certain ointments to your skin may help relieve the pain and stiffness of osteoarthritis. Ask your pharmacist for suggestions. Many over the-counter products are approved for temporary relief of arthritis pain.   In some countries, doctors often prescribe topical NSAID's for relief of chronic conditions such as arthritis and tendinitis. A review of treatment with NSAID creams found that they worked as well as oral medications but without the serious side effects.  PREVENTION  Maintain a healthy weight. Extra pounds put more strain on your joints.   Get strong, stay limber. Weak muscles are a common cause of knee injuries. Stretching is important. Include flexibility exercises in your workouts.   Be smart about exercise. If you have osteoarthritis, chronic knee pain or recurring injuries, you may need to change the way you exercise. This does not mean you have to stop being  active. If your knees ache after jogging or playing basketball, consider switching to swimming, water aerobics or other low-impact activities, at least for a few days a week. Sometimes limiting high-impact activities will provide relief.   Make sure your shoes fit well. Choose footwear that is right for your sport.   Protect your knees. Use the proper gear for knee-sensitive activities. Use kneepads when playing volleyball or laying carpet. Buckle your seat belt every time you drive. Most shattered kneecaps occur in car accidents.   Rest when you are tired.  SEEK MEDICAL CARE IF:  You have knee pain that is continual and does not seem to be getting better.  SEEK IMMEDIATE MEDICAL CARE IF:  Your knee joint feels hot to the touch and you have a high fever. MAKE SURE YOU:   Understand these instructions.   Will watch your condition.   Will get help right away if you are not doing well or get worse.  Document  Released: 03/14/2007 Document Revised: 05/06/2011 Document Reviewed: 03/14/2007 [facility] Patient Information 2012 [place], [place].

## 2011-08-13 NOTE — ED Provider Notes (Signed)
History     CSN: [id number]  Arrival date & time 08/13/11  1256   First MD Initiated Contact with Patient 08/13/11 1354      Chief Complaint  Patient presents with  . Knee Pain    (Consider location/radiation/quality/duration/timing/severity/associated sxs/prior treatment) HPI Comments: Patient presents with a two-month history of persistent right knee pain with intermittent swelling. Denies any new injury. He does report a remote twisting injury to one of his knees approximately 10 years ago that he did not receive treatment for from an orthopedic specialist, though he is not sure if it is the currently affected knee. He reports associated popping in the knee. Denies redness, increased warmth, difficulty ambulating, weakness or numbness distal to the knee. 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 The pain is worse at night and he has difficulty sleeping. Motrin and narcotic pain medication make it better. He was seen in any plan for this complaint approximately 6 weeks ago and was given a referral to an orthopedist, but he has not followed up.  Patient is a 51 y.o. male presenting with knee pain. The history is provided by the patient.  Knee Pain Pertinent negatives include no chills, fever, numbness or weakness.    History reviewed. No pertinent past medical history.  Past Surgical History  Procedure Date  . Hernia repair     No family history on file.  History  Substance Use Topics  . Smoking status: Current Everyday Smoker  . Smokeless tobacco: Not on file  . Alcohol Use: Yes     occasionally      Review of Systems  Constitutional: Negative for fever and chills.  Musculoskeletal:       See history of present illness, otherwise negative  Skin: Negative for color change and wound.  Neurological: Negative for weakness and numbness.    Allergies  Review of patient's allergies  indicates no known allergies.  Home Medications   Current Outpatient Rx  Name Route Sig Dispense Refill  . IBUPROFEN 200 MG PO TABS Oral Take 600 mg by mouth every 6 (six) hours as needed. Swelling.      BP 126/92  Pulse 94  Temp(Src) 97.7 F (36.5 C) (Oral)  Resp 14  SpO2 96%  Physical Exam  Nursing note and vitals reviewed. Constitutional: He is oriented to person, place, and time. He appears well-developed and well-nourished. No distress.  HENT:  Head: Normocephalic and atraumatic.  Eyes: Conjunctivae are normal.  Neck: Neck supple.  Cardiovascular: Normal rate and regular rhythm.   Pulses:      Dorsalis pedis pulses are 2+ on the right side, and 2+ on the left side.       Posterior tibial pulses are 2+ on the right side, and 2+ on the left side.  Pulmonary/Chest: No respiratory distress.  Musculoskeletal: Normal range of motion.       Right knee: He exhibits normal range of motion, no ecchymosis, no deformity, no erythema, normal alignment, no LCL laxity, normal patellar mobility and no MCL laxity. Swelling: scant swelling is seen to the superior medial aspect of the knee. tenderness found. Medial joint line and lateral joint line tenderness noted. No patellar tendon tenderness noted.       Strength in bilateral lower extremities is 5 out of 5 and symmetric as tested in hip flexion extension, knee flexion-extension, foot plantar and dorsi flexion.  Neurological: He is alert  and oriented to person, place, and time.       Sensation is intact to light touch and symmetric in bilateral lower extremities. Patellar reflexes are 2+ bilaterally.  Skin: Skin is warm and dry. No rash noted. No erythema.  Psychiatric: He has a normal mood and affect.    ED Course  Procedures (including critical care time)  Labs Reviewed - No data to display No results found.   Dx 1: Right knee pain   MDM  Persistent knee pain after prior evaluation with no new injury and no remarkable  examination findings. A review of the patient's old chart demonstrates imaging of the right knee on 07/09/2011 with a small joint effusion. I've stressed the importance of appropriate orthopedic followup to the patient and we discussed the inability of the emergency department to determine the definitive treatment necessary for his knee pain in the setting of normal neurologic and motor function and negative x-rays. A knee sleeve is placed per his request. He also requests pain medication to help him sleep at night. I will give him only a short course of pain medication as he did have some written for him at his last ER visit.        Shaaron Adler, [place] 08/13/11 1448

## 2011-08-14 NOTE — ED Provider Notes (Signed)
Medical screening examination/treatment/procedure(s) were performed by non-physician practitioner and as supervising physician I was immediately available for consultation/collaboration.  Flint Melter, MD 08/14/11 [phone]

## 2012-10-20 ENCOUNTER — Encounter (HOSPITAL_COMMUNITY): Payer: Self-pay | Admitting: Emergency Medicine

## 2012-10-20 ENCOUNTER — Emergency Department (HOSPITAL_COMMUNITY)
Admission: EM | Admit: 2012-10-20 | Discharge: 2012-10-20 | Disposition: A | Discharge status: Home / Self Care | Payer: BC Managed Care – PPO | Attending: Emergency Medicine | Admitting: Emergency Medicine

## 2012-10-20 ENCOUNTER — Emergency Department (HOSPITAL_COMMUNITY): Payer: BC Managed Care – PPO

## 2012-10-20 DIAGNOSIS — R0789 Other chest pain: Secondary | ICD-10-CM | POA: Insufficient documentation

## 2012-10-20 DIAGNOSIS — R209 Unspecified disturbances of skin sensation: Secondary | ICD-10-CM | POA: Insufficient documentation

## 2012-10-20 DIAGNOSIS — F17200 Nicotine dependence, unspecified, uncomplicated: Secondary | ICD-10-CM | POA: Insufficient documentation

## 2012-10-20 LAB — BASIC METABOLIC PANEL
CO2: 27 mEq/L (ref 19–32)
Calcium: 9.2 mg/dL (ref 8.4–10.5)
Chloride: 104 mEq/L (ref 96–112)
Creatinine, Ser: 0.89 mg/dL (ref 0.50–1.35)
Glucose, Bld: 128 mg/dL — ABNORMAL HIGH (ref 70–99)

## 2012-10-20 LAB — CBC
HCT: 42 % (ref 39.0–52.0)
Hemoglobin: 13.8 g/dL (ref 13.0–17.0)
MCH: 30 pg (ref 26.0–34.0)
MCV: 91.3 fL (ref 78.0–100.0)
Platelets: 211 10*3/uL (ref 150–400)
RBC: 4.6 MIL/uL (ref 4.22–5.81)
WBC: 6.3 10*3/uL (ref 4.0–10.5)

## 2012-10-20 LAB — TROPONIN I: Troponin I: <0.3 ng/mL (ref <0.30)

## 2012-10-20 MED ORDER — NITROGLYCERIN 0.4 MG SL SUBL
0.4000 mg | SUBLINGUAL_TABLET | SUBLINGUAL | Status: DC | PRN
  Administered 2012-10-20: 0.4 mg via SUBLINGUAL
  Filled 2012-10-20: qty 25

## 2012-10-20 MED ORDER — ASPIRIN 81 MG PO CHEW
324.0000 mg | CHEWABLE_TABLET | Freq: Once | ORAL | Status: AC
  Administered 2012-10-20: 324 mg via ORAL
  Filled 2012-10-20: qty 4

## 2012-10-20 NOTE — ED Notes (Signed)
PA at bedside.

## 2012-10-20 NOTE — ED Provider Notes (Signed)
Medical screening examination/treatment/procedure(s) were conducted as a shared visit with non-physician practitioner(s) and myself.  I personally evaluated the patient during the encounter   Glynn Octave, MD 10/20/12 1606

## 2012-10-20 NOTE — ED Notes (Addendum)
Pt states has slight headache but denies any chest pain at this time. Pt states that Nitro relieved the pain.  Thom Chimes PA aware.

## 2012-10-20 NOTE — ED Provider Notes (Signed)
History     CSN: [id number]  Arrival date & time 10/20/12  1132   First MD Initiated Contact with Patient 10/20/12 1153      Chief Complaint  Patient presents with  . Chest Pain    (Consider location/radiation/quality/duration/timing/severity/associated sxs/prior treatment) Patient is a 52 y.o. male presenting with chest pain. The history is provided by the patient. No language interpreter was used.  Chest Pain Pain location:  L chest Pain quality: sharp   Pain radiates to the back: no   Pain severity:  Moderate Onset quality:  Sudden Progression:  Waxing and waning Associated symptoms: no cough, no fever and no shortness of breath   Associated symptoms comment:  He complains of tingling in left hand and forearm x one week. 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 Today while working, he had a sharp pain across his left breast. No SOB, nausea vomiting or diaphoresis. The pain is constant but less intensity currently.    History reviewed. No pertinent past medical history.  Past Surgical History  Procedure Laterality Date  . Hernia repair      No family history on file.  History  Substance Use Topics  . Smoking status: Current Every Day Smoker -- 0.50 packs/day    Types: Cigarettes  . Smokeless tobacco: Not on file  . Alcohol Use: Yes     Comment: occasionally      Review of Systems  Constitutional: Negative for fever.  HENT: Negative.   Respiratory: Negative for cough and shortness of breath.   Cardiovascular: Positive for chest pain. Negative for leg swelling.  Gastrointestinal: Negative.   Musculoskeletal: Negative.   Skin: Negative.   Neurological:       Complains of tingling into left hand and forearm x 1 week without weakness.    Allergies  Review of patient's allergies indicates no known allergies.  Home Medications   Current Outpatient Rx  Name  Route  Sig  Dispense  Refill  . ibuprofen (ADVIL,MOTRIN) 200 MG tablet  Oral   Take 400 mg by mouth every 6 (six) hours as needed for pain.            BP 155/94  Pulse 99  Temp(Src) 98.4 F (36.9 C) (Oral)  Resp 20  SpO2 97%  Physical Exam  Constitutional: He is oriented to person, place, and time. He appears well-developed and well-nourished.  HENT:  Head: Normocephalic.  Neck: Normal range of motion. Neck supple.  Cardiovascular: Normal rate and regular rhythm.   Pulmonary/Chest: Effort normal and breath sounds normal.  Abdominal: Soft. Bowel sounds are normal. There is no tenderness. There is no rebound and no guarding.  Musculoskeletal: Normal range of motion. He exhibits no edema.  Neurological: He is alert and oriented to person, place, and time.  Skin: Skin is warm and dry. No rash noted.  Psychiatric: He has a normal mood and affect.    ED Course  Procedures (including critical care time)  Labs Reviewed  CBC  BASIC METABOLIC PANEL  TROPONIN I   No results found.   Date: 10/20/2012  Rate: 65  Rhythm: normal sinus rhythm  QRS Axis: normal  Intervals: normal  ST/T Wave abnormalities: normal  Conduction Disutrbances:none  Narrative Interpretation:   Old EKG Reviewed: none available   No diagnosis found. 1. Chest pain    MDM  Normal EKG, symptoms atypical for ACS. Minimal risk factors however, will do two troponins and, if negative, will refer to  cardiology. Patient care transferred to [facility] pending labs/xray.        Arnoldo Hooker, PA-C 10/20/12 1225

## 2012-10-20 NOTE — ED Provider Notes (Signed)
Care assumed from [place], [place].  Daniel Stevens is a 52 y.o. male presents with atypical chest pain described as a sharp pain across the L breast without associated SOB, nausea, vomiting, diaphoresis, weakness or syncope.  The pain has remained constant, but has decreased in intensity.  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    On exam pts pain continues to decrease.  Normal ECG and pt with some   Plan:  D/c home if Troponin x2 negative.  3:15 PM Delta troponin negative.  Pt is pain free at this time and requesting to be d/c home.  Recommend using the resource guide to find a PCP and cardiology to have an outpatient stress test.    Chest pain is not likely of cardiac or pulmonary etiology d/t presentation, perc negative, VSS, no tracheal deviation, no JVD or new murmur, RRR, breath sounds equal bilaterally, EKG without acute abnormalities, negative troponin x2, and negative CXR. Pt has been advised to follow up with PCP in regards to today's hospital visit and return to the ED if CP becomes exertional, associated with diaphoresis or nausea, radiates to left jaw/arm, worsens or becomes concerning in any way. Pt appears reliable for follow up and is agreeable to discharge.   Case has been discussed with and seen by Dr. Manus Gunning who agrees with the above plan to discharge.   Dahlia Client Jerrilyn Messinger, PA-C 10/20/12 1519

## 2012-10-20 NOTE — ED Notes (Signed)
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

## 2012-10-20 NOTE — ED Provider Notes (Signed)
Medical screening examination/treatment/procedure(s) were conducted as a shared visit with non-physician practitioner(s) and myself.  I personally evaluated the patient during the encounter  Sharp left-sided chest pain that lasted less than a minute. Did not radiate. No shortness of breath, nausea, vomiting or diaphoresis. Intermittent tingling in the left hand and forearm for 1 week. Works as a Museum/gallery exhibitions officer. Cardiac risk factors include obesity and tobacco abuse. Atypical for ACS. We'll obtain cardiac enzymes.   Glynn Octave, MD 10/20/12 [phone]

## 2013-07-13 ENCOUNTER — Ambulatory Visit (INDEPENDENT_AMBULATORY_CARE_PROVIDER_SITE_OTHER): Payer: BC Managed Care – PPO | Admitting: Family Medicine

## 2013-07-13 VITALS — BP 126/84 | HR 62 | Temp 97.6°F | Resp 20 | Ht 68.5 in | Wt 211.2 lb

## 2013-07-13 DIAGNOSIS — R51 Headache: Secondary | ICD-10-CM

## 2013-07-13 DIAGNOSIS — K089 Disorder of teeth and supporting structures, unspecified: Secondary | ICD-10-CM

## 2013-07-13 DIAGNOSIS — K0889 Other specified disorders of teeth and supporting structures: Secondary | ICD-10-CM

## 2013-07-13 DIAGNOSIS — J0100 Acute maxillary sinusitis, unspecified: Secondary | ICD-10-CM

## 2013-07-13 DIAGNOSIS — K029 Dental caries, unspecified: Secondary | ICD-10-CM

## 2013-07-13 MED ORDER — AMOXICILLIN-POT CLAVULANATE 875-125 MG PO TABS: 1.0000 | ORAL_TABLET | Freq: Two times a day (BID) | ORAL | Status: DC

## 2013-07-13 NOTE — Progress Notes (Addendum)
Subjective:    Patient ID: Daniel Stevens, male    DOB: [birth year], 53 y.o.   MRN: [id number] This chart was scribed for Merri Ray, MD by Rolanda Lundborg, ED Scribe. This patient was seen in room 8 and the patient's care was started at 11:03 AM.  Authored by Janeann Forehand. MD, unable to change in [facility].  Chief Complaint  Patient presents with   Sore Throat    x 5 days     Nasal Congestion    headache; sinus pressure   Dental Pain    HPI HPI Comments: Daniel Stevens is a 53 y.o. male who presents to the [facility] complaining of constant, gradually-worsening sinus infection onset 5 days ago with sore throat, nasal congestion, frontal headache, and sinus pressure. He states the sinus pressure is localized to his left upper cheek and left eye. He reports throbbing tooth pain in the left upper molars. He denies fevers. He took Dayquil this morning. He reports h/o similar sinus pressure.   PCP - No primary provider on file.   There are no active problems to display for this patient.  History reviewed. No pertinent past medical history. Past Surgical History  Procedure Laterality Date   Hernia repair     No Known Allergies Prior to Admission medications   Medication Sig Start Date End Date Taking? Authorizing Provider  ibuprofen (ADVIL,MOTRIN) 200 MG tablet Take 400 mg by mouth every 6 (six) hours as needed for pain.    Yes Historical Provider, MD    History  Substance Use Topics   Smoking status: Former Smoker -- 0.50 packs/day    Types: Cigarettes    Quit date: 06/22/2013   Smokeless tobacco: Not on file   Alcohol Use: No     Comment: occasionally      Review of Systems  Constitutional: Negative for fever.  HENT: Positive for congestion, dental problem, sinus pressure and sore throat.   Neurological: Positive for headaches.       Objective:   Physical Exam  Vitals reviewed. Constitutional: He is oriented to person, place, and time. He  appears well-developed and well-nourished.  HENT:  Head: Normocephalic and atraumatic.  Right Ear: Tympanic membrane, external ear and ear canal normal.  Left Ear: Tympanic membrane, external ear and ear canal normal.  Nose: No rhinorrhea.  Mouth/Throat: Mucous membranes are normal. Posterior oropharyngeal erythema (minimal) present. No oropharyngeal exudate.  Upper left molars with decay and partially missing but no surrounding gum tenderness or discharge. Left side and frontal maxillary sinuses are tender. 2nd molar slight tederness of the buccal gum without abscess noted.  Eyes: Conjunctivae are normal. Pupils are equal, round, and reactive to light.  Neck: Neck supple.  Cardiovascular: Normal rate, regular rhythm, normal heart sounds and intact distal pulses.   No murmur heard. Pulmonary/Chest: Effort normal and breath sounds normal. He has no wheezes. He has no rhonchi. He has no rales.  Abdominal: Soft. There is no tenderness.  Lymphadenopathy:    He has no cervical adenopathy.  Neurological: He is alert and oriented to person, place, and time.  Skin: Skin is warm and dry. No rash noted.  Psychiatric: He has a normal mood and affect. His behavior is normal.    Filed Vitals:   07/13/13 1022  BP: 126/84  Pulse: 62  Temp: 97.6 F (36.4 C)  TempSrc: Oral  Resp: 20  Height: 5' 8.5" (1.74 m)  Weight: 211 lb 3.2 oz (95.8 kg)  SpO2:  97%         Assessment & Plan:   Daniel Stevens is a 53 y.o. male Headache(784.0), Sinusitis, acute maxillary - Plan: amoxicillin-clavulanate (AUGMENTIN) 875-125 MG per tablet,   - L maxillary sinusitis likely cause of tooth pain, but discussed underlying dental decay and follow up with dentist. augmentin rx.  Saline ns and other sx care below. rtc precautions.   Tooth pain, Dental decay  - no apparent periodontal abscess at this point, but precautions discussed and need for dental follow up.    Meds ordered this encounter  Medications    amoxicillin-clavulanate (AUGMENTIN) 875-125 MG per tablet    Sig: Take 1 tablet by mouth 2 (two) times daily.    Dispense:  20 tablet    Refill:  0   Patient Instructions  Start augmentin. Saline nasal spray atleast 4 times per day,  drink plenty of fluids. Tylenol or advil if needed for headache. If tooth pain not improving into next week, or more sore to press on actual tooth - you need to be seen by your dentist as soon as possible. Return to the clinic or go to the nearest emergency room if any of your symptoms worsen or new symptoms occur. Sinusitis Sinusitis is redness, soreness, and swelling (inflammation) of the paranasal sinuses. Paranasal sinuses are air pockets within the bones of your face (beneath the eyes, the middle of the forehead, or above the eyes). In healthy paranasal sinuses, mucus is able to drain out, and air is able to circulate through them by way of your nose. However, when your paranasal sinuses are inflamed, mucus and air can become trapped. This can allow bacteria and other germs to grow and cause infection. Sinusitis can develop quickly and last only a short time (acute) or continue over a long period (chronic). Sinusitis that lasts for more than 12 weeks is considered chronic.  CAUSES  Causes of sinusitis include:  Allergies.  Structural abnormalities, such as displacement of the cartilage that separates your nostrils (deviated septum), which can decrease the air flow through your nose and sinuses and affect sinus drainage.  Functional abnormalities, such as when the small hairs (cilia) that line your sinuses and help remove mucus do not work properly or are not present. SYMPTOMS  Symptoms of acute and chronic sinusitis are the same. The primary symptoms are pain and pressure around the affected sinuses. Other symptoms include:  Upper toothache.  Earache.  Headache.  Bad breath.  Decreased sense of smell and taste.  A cough, which worsens when you are  lying flat.  Fatigue.  Fever.  Thick drainage from your nose, which often is green and may contain pus (purulent).  Swelling and warmth over the affected sinuses. DIAGNOSIS  Your caregiver will perform a physical exam. During the exam, your caregiver may:  Look in your nose for signs of abnormal growths in your nostrils (nasal polyps).  Tap over the affected sinus to check for signs of infection.  View the inside of your sinuses (endoscopy) with a special imaging device with a light attached (endoscope), which is inserted into your sinuses. If your caregiver suspects that you have chronic sinusitis, one or more of the following tests may be recommended:  Allergy tests.  Nasal culture A sample of mucus is taken from your nose and sent to a lab and screened for bacteria.  Nasal cytology A sample of mucus is taken from your nose and examined by your caregiver to determine if your sinusitis is  related to an allergy. TREATMENT  Most cases of acute sinusitis are related to a viral infection and will resolve on their own within 10 days. Sometimes medicines are prescribed to help relieve symptoms (pain medicine, decongestants, nasal steroid sprays, or saline sprays).  However, for sinusitis related to a bacterial infection, your caregiver will prescribe antibiotic medicines. These are medicines that will help kill the bacteria causing the infection.  Rarely, sinusitis is caused by a fungal infection. In theses cases, your caregiver will prescribe antifungal medicine. For some cases of chronic sinusitis, surgery is needed. Generally, these are cases in which sinusitis recurs more than 3 times per year, despite other treatments. HOME CARE INSTRUCTIONS   Drink plenty of water. Water helps thin the mucus so your sinuses can drain more easily.  Use a humidifier.  Inhale steam 3 to 4 times a day (for example, sit in the bathroom with the shower running).  Apply a warm, moist washcloth to your  face 3 to 4 times a day, or as directed by your caregiver.  Use saline nasal sprays to help moisten and clean your sinuses.  Take over-the-counter or prescription medicines for pain, discomfort, or fever only as directed by your caregiver. SEEK IMMEDIATE MEDICAL CARE IF:  You have increasing pain or severe headaches.  You have nausea, vomiting, or drowsiness.  You have swelling around your face.  You have vision problems.  You have a stiff neck.  You have difficulty breathing. MAKE SURE YOU:   Understand these instructions.  Will watch your condition.  Will get help right away if you are not doing well or get worse. Document Released: 05/17/2005 Document Revised: 08/09/2011 Document Reviewed: 06/01/2011 [facility] Patient Information 2014 [place], [place]. Dental Caries  Dental caries (also called tooth decay) is the most common oral disease. It can occur at any age, but is more common in children and young adults.  HOW DENTAL CARIES DEVELOPS  The process of decay begins when bacteria and foods (particularly sugars and starches) combine in your mouth to produce plaque. Plaque is a substance that sticks to the hard, outer surface of a tooth (enamel). The bacteria in plaque produce acids that attack enamel. These acids may also attack the root surface of a tooth (cementum) if it is exposed. Repeated attacks dissolve these surfaces and create holes in the tooth (cavities). If left untreated, the acids destroy the other layers of the tooth.  RISK FACTORS  Frequent sipping of sugary beverages.   Frequent snacking on sugary and starchy foods, especially those that easily get stuck in the teeth.   Poor oral hygiene.   Dry mouth.   Substance abuse such as methamphetamine abuse.   Broken or poor-fitting dental restorations.   Eating disorders.   Gastroesophageal reflux disease (GERD).   Certain radiation treatments to the head and neck. SYMPTOMS In the early stages of  dental caries, symptoms are seldom present. Sometimes white, chalky areas may be seen on the enamel or other tooth layers. In later stages, symptoms may include:  Pits and holes on the enamel.  Toothache after sweet, hot, or cold foods or drinks are consumed.  Pain around the tooth.  Swelling around the tooth. DIAGNOSIS  Most of the time, dental caries is detected during a regular dental checkup. A diagnosis is made after a thorough medical and dental history is taken and the surfaces of your teeth are checked for signs of dental caries. Sometimes special instruments, such as lasers, are used to check for  dental caries. Dental X-ray exams may be taken so that areas not visible to the eye (such as between the contact areas of the teeth) can be checked for cavities.  TREATMENT  If dental caries is in its early stages, it may be reversed with a fluoride treatment or an application of a remineralizing agent at the dental office. Thorough brushing and flossing at home is needed to aid these treatments. If it is in its later stages, treatment depends on the location and extent of tooth destruction:   If a small area of the tooth has been destroyed, the destroyed area will be removed and cavities will be filled with a material such as gold, silver amalgam, or composite resin.   If a large area of the tooth has been destroyed, the destroyed area will be removed and a cap (crown) will be fitted over the remaining tooth structure.   If the center part of the tooth (pulp) is affected, a procedure called a root canal will be needed before a filling or crown can be placed.   If most of the tooth has been destroyed, the tooth may need to be pulled (extracted). HOME CARE INSTRUCTIONS You can prevent, stop, or reverse dental caries at home by practicing good oral hygiene. Good oral hygiene includes:  Thoroughly cleaning your teeth at least twice a day with a toothbrush and dental floss.   Using a  fluoride toothpaste. A fluoride mouth rinse may also be used if recommended by your dentist or health care provider.   Restricting the amount of sugary and starchy foods and sugary liquids you consume.   Avoiding frequent snacking on these foods and sipping of these liquids.   Keeping regular visits with a dentist for checkups and cleanings. PREVENTION   Practice good oral hygiene.  Consider a dental sealant. A dental sealant is a coating material that is applied by your dentist to the pits and grooves of teeth. The sealant prevents food from being trapped in them. It may protect the teeth for several years.  Ask about fluoride supplements if you live in a community without fluorinated water or with water that has a low fluoride content. Use fluoride supplements as directed by your dentist or health care provider.  Allow fluoride varnish applications to teeth if directed by your dentist or health care provider. Document Released: 02/06/2002 Document Revised: 01/17/2013 Document Reviewed: 05/19/2012 [facility] Patient Information 2014 [place].     I personally performed the services described in this documentation, which was scribed in my presence. The recorded information has been reviewed and considered, and addended by me as needed.

## 2013-07-13 NOTE — Patient Instructions (Signed)
Start augmentin. Saline nasal spray atleast 4 times per day,  drink plenty of fluids. Tylenol or advil if needed for headache. If tooth pain not improving into next week, or more sore to press on actual tooth - you need to be seen by your dentist as soon as possible. Return to the clinic or go to the nearest emergency room if any of your symptoms worsen or new symptoms occur. Sinusitis Sinusitis is redness, soreness, and swelling (inflammation) of the paranasal sinuses. Paranasal sinuses are air pockets within the bones of your face (beneath the eyes, the middle of the forehead, or above the eyes). In healthy paranasal sinuses, mucus is able to drain out, and air is able to circulate through them by way of your nose. However, when your paranasal sinuses are inflamed, mucus and air can become trapped. This can allow bacteria and other germs to grow and cause infection. Sinusitis can develop quickly and last only a short time (acute) or continue over a long period (chronic). Sinusitis that lasts for more than 12 weeks is considered chronic.  CAUSES  Causes of sinusitis include:  Allergies.  Structural abnormalities, such as displacement of the cartilage that separates your nostrils (deviated septum), which can decrease the air flow through your nose and sinuses and affect sinus drainage.  Functional abnormalities, such as when the small hairs (cilia) that line your sinuses and help remove mucus do not work properly or are not present. SYMPTOMS  Symptoms of acute and chronic sinusitis are the same. The primary symptoms are pain and pressure around the affected sinuses. Other symptoms include:  Upper toothache.  Earache.  Headache.  Bad breath.  Decreased sense of smell and taste.  A cough, which worsens when you are lying flat.  Fatigue.  Fever.  Thick drainage from your nose, which often is green and may contain pus (purulent).  Swelling and warmth over the affected sinuses. DIAGNOSIS   Your caregiver will perform a physical exam. During the exam, your caregiver may:  Look in your nose for signs of abnormal growths in your nostrils (nasal polyps).  Tap over the affected sinus to check for signs of infection.  View the inside of your sinuses (endoscopy) with a special imaging device with a light attached (endoscope), which is inserted into your sinuses. If your caregiver suspects that you have chronic sinusitis, one or more of the following tests may be recommended:  Allergy tests.  Nasal culture A sample of mucus is taken from your nose and sent to a lab and screened for bacteria.  Nasal cytology A sample of mucus is taken from your nose and examined by your caregiver to determine if your sinusitis is related to an allergy. TREATMENT  Most cases of acute sinusitis are related to a viral infection and will resolve on their own within 10 days. Sometimes medicines are prescribed to help relieve symptoms (pain medicine, decongestants, nasal steroid sprays, or saline sprays).  However, for sinusitis related to a bacterial infection, your caregiver will prescribe antibiotic medicines. These are medicines that will help kill the bacteria causing the infection.  Rarely, sinusitis is caused by a fungal infection. In theses cases, your caregiver will prescribe antifungal medicine. For some cases of chronic sinusitis, surgery is needed. Generally, these are cases in which sinusitis recurs more than 3 times per year, despite other treatments. HOME CARE INSTRUCTIONS   Drink plenty of water. Water helps thin the mucus so your sinuses can drain more easily.  Use a humidifier.  Inhale steam 3 to 4 times a day (for example, sit in the bathroom with the shower running).  Apply a warm, moist washcloth to your face 3 to 4 times a day, or as directed by your caregiver.  Use saline nasal sprays to help moisten and clean your sinuses.  Take over-the-counter or prescription medicines for  pain, discomfort, or fever only as directed by your caregiver. SEEK IMMEDIATE MEDICAL CARE IF:  You have increasing pain or severe headaches.  You have nausea, vomiting, or drowsiness.  You have swelling around your face.  You have vision problems.  You have a stiff neck.  You have difficulty breathing. MAKE SURE YOU:   Understand these instructions.  Will watch your condition.  Will get help right away if you are not doing well or get worse. Document Released: 05/17/2005 Document Revised: 08/09/2011 Document Reviewed: 06/01/2011 [facility] Patient Information 2014 [place], [place]. Dental Caries  Dental caries (also called tooth decay) is the most common oral disease. It can occur at any age, but is more common in children and young adults.  HOW DENTAL CARIES DEVELOPS  The process of decay begins when bacteria and foods (particularly sugars and starches) combine in your mouth to produce plaque. Plaque is a substance that sticks to the hard, outer surface of a tooth (enamel). The bacteria in plaque produce acids that attack enamel. These acids may also attack the root surface of a tooth (cementum) if it is exposed. Repeated attacks dissolve these surfaces and create holes in the tooth (cavities). If left untreated, the acids destroy the other layers of the tooth.  RISK FACTORS  Frequent sipping of sugary beverages.   Frequent snacking on sugary and starchy foods, especially those that easily get stuck in the teeth.   Poor oral hygiene.   Dry mouth.   Substance abuse such as methamphetamine abuse.   Broken or poor-fitting dental restorations.   Eating disorders.   Gastroesophageal reflux disease (GERD).   Certain radiation treatments to the head and neck. SYMPTOMS In the early stages of dental caries, symptoms are seldom present. Sometimes white, chalky areas may be seen on the enamel or other tooth layers. In later stages, symptoms may include:  Pits and holes on  the enamel.  Toothache after sweet, hot, or cold foods or drinks are consumed.  Pain around the tooth.  Swelling around the tooth. DIAGNOSIS  Most of the time, dental caries is detected during a regular dental checkup. A diagnosis is made after a thorough medical and dental history is taken and the surfaces of your teeth are checked for signs of dental caries. Sometimes special instruments, such as lasers, are used to check for dental caries. Dental X-ray exams may be taken so that areas not visible to the eye (such as between the contact areas of the teeth) can be checked for cavities.  TREATMENT  If dental caries is in its early stages, it may be reversed with a fluoride treatment or an application of a remineralizing agent at the dental office. Thorough brushing and flossing at home is needed to aid these treatments. If it is in its later stages, treatment depends on the location and extent of tooth destruction:   If a small area of the tooth has been destroyed, the destroyed area will be removed and cavities will be filled with a material such as gold, silver amalgam, or composite resin.   If a large area of the tooth has been destroyed, the destroyed area will be  removed and a cap (crown) will be fitted over the remaining tooth structure.   If the center part of the tooth (pulp) is affected, a procedure called a root canal will be needed before a filling or crown can be placed.   If most of the tooth has been destroyed, the tooth may need to be pulled (extracted). HOME CARE INSTRUCTIONS You can prevent, stop, or reverse dental caries at home by practicing good oral hygiene. Good oral hygiene includes:  Thoroughly cleaning your teeth at least twice a day with a toothbrush and dental floss.   Using a fluoride toothpaste. A fluoride mouth rinse may also be used if recommended by your dentist or health care provider.   Restricting the amount of sugary and starchy foods and sugary  liquids you consume.   Avoiding frequent snacking on these foods and sipping of these liquids.   Keeping regular visits with a dentist for checkups and cleanings. PREVENTION   Practice good oral hygiene.  Consider a dental sealant. A dental sealant is a coating material that is applied by your dentist to the pits and grooves of teeth. The sealant prevents food from being trapped in them. It may protect the teeth for several years.  Ask about fluoride supplements if you live in a community without fluorinated water or with water that has a low fluoride content. Use fluoride supplements as directed by your dentist or health care provider.  Allow fluoride varnish applications to teeth if directed by your dentist or health care provider. Document Released: 02/06/2002 Document Revised: 01/17/2013 Document Reviewed: 05/19/2012 [facility] Patient Information 2014 [place].

## 2014-06-04 ENCOUNTER — Emergency Department (HOSPITAL_COMMUNITY)
Admission: EM | Admit: 2014-06-04 | Discharge: 2014-06-04 | Disposition: A | Discharge status: Home / Self Care | Payer: Self-pay | Source: Home / Self Care

## 2015-03-07 ENCOUNTER — Ambulatory Visit (INDEPENDENT_AMBULATORY_CARE_PROVIDER_SITE_OTHER): Payer: 59 | Admitting: Emergency Medicine

## 2015-03-07 ENCOUNTER — Ambulatory Visit (INDEPENDENT_AMBULATORY_CARE_PROVIDER_SITE_OTHER): Payer: 59

## 2015-03-07 VITALS — BP 146/86 | HR 98 | Temp 98.6°F | Resp 16 | Ht 68.5 in | Wt 207.4 lb

## 2015-03-07 DIAGNOSIS — M25562 Pain in left knee: Secondary | ICD-10-CM | POA: Diagnosis not present

## 2015-03-07 MED ORDER — MELOXICAM 15 MG PO TABS: 15.0000 mg | ORAL_TABLET | Freq: Every day | ORAL | Status: DC

## 2015-03-07 NOTE — Patient Instructions (Signed)

## 2015-03-07 NOTE — Progress Notes (Addendum)
Patient ID: Daniel Stevens, male   DOB: 1961-05-04, 54 y.o.   MRN: [id number]     This chart was scribed for Arlyss Queen, MD by Zola Button, Medical Scribe. This patient was seen in room 11 and the patient's care was started at 9:55 AM.   Chief Complaint:  Chief Complaint  Patient presents with  . Knee Problem    left knee, swollen, slight pain    HPI: Daniel Stevens is a 54 y.o. male who reports to [facility] today complaining of gradual onset left knee pain with swelling that started this morning. He does feel as if the knee could give out. Patient denies pain in his right knee. He denies locking of the knee. 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 He also notes that he was dancing for two nights in a row this past weekend. He did have problems in the same knee years ago, noting that he had swelling with fluid in his knee.  History reviewed. No pertinent past medical history. Past Surgical History  Procedure Laterality Date  . Hernia repair     Social History   Social History  . Marital Status: Single    Spouse Name: N/A  . Number of Children: N/A  . Years of Education: N/A   Social History Main Topics  . Smoking status: Former Smoker -- 0.50 packs/day    Types: Cigarettes    Quit date: 06/22/2013  . Smokeless tobacco: None  . Alcohol Use: No     Comment: occasionally  . Drug Use: No  . Sexual Activity: Not Currently   Other Topics Concern  . None   Social History Narrative   Family History  Problem Relation Age of Onset  . Diabetes Mother    No Known Allergies Prior to Admission medications   Medication Sig Start Date End Date Taking? Authorizing Provider  amoxicillin-clavulanate (AUGMENTIN) 875-125 MG per tablet Take 1 tablet by mouth 2 (two) times daily. Patient not taking: Reported on 03/07/2015 07/13/13   Wendie Agreste, MD  ibuprofen (ADVIL,MOTRIN) 200 MG tablet Take 400 mg by mouth  every 6 (six) hours as needed for pain.     Historical Provider, MD     ROS: The patient denies fevers, chills, night sweats, unintentional weight loss, chest pain, palpitations, wheezing, dyspnea on exertion, nausea, vomiting, abdominal pain, dysuria, hematuria, melena, numbness, or tingling.   All other systems have been reviewed and were otherwise negative with the exception of those mentioned in the HPI and as above.    PHYSICAL EXAM: Filed Vitals:   03/07/15 0943  BP: 146/86  Pulse: 98  Temp: 98.6 F (37 C)  Resp: 16   Body mass index is 31.07 kg/(m^2).   General: Alert, no acute distress HEENT:  Normocephalic, atraumatic, oropharynx patent. Eye: Juliette Mangle [facility] Cardiovascular:  Regular rate and rhythm, no rubs murmurs or gallops.  No Carotid bruits, radial pulse intact. No pedal edema.  Respiratory: Clear to auscultation bilaterally.  No wheezes, rales, or rhonchi.  No cyanosis, no use of accessory musculature Abdominal: No organomegaly, abdomen is soft and non-tender, positive bowel sounds.  No masses. Musculoskeletal: Tender over anserine bursa. Full ROM of the left knee. Knee joint is stable. Negative Lachman. Skin: No rashes. Neurologic: Facial musculature symmetric. Psychiatric: Patient acts appropriately throughout our interaction. Lymphatic: No cervical or submandibular lymphadenopathy    LABS:    EKG/XRAY:   Primary read  interpreted by [facility] at [facility]. No definite abnormality seen. There is a faint line over the proximal head of the fibula. Please comment.   ASSESSMENT/PLAN: I suspect this is more of a bursitis in the knee. Will treat with meloxicam and ice. He was given a note for 2 days out of work.  By signing my name below, I, Zola Button, attest that this documentation has been prepared under the direction and in the presence of Arlyss Queen, MD.  Electronically Signed: Zola Button, Medical Scribe. 03/07/2015. 9:55 AM.   Johney Maine sideeffects, risk and  benefits, and alternatives of medications d/w patient. Patient is aware that all medications have potential sideeffects and we are unable to predict every sideeffect or drug-drug interaction that may occur.  Arlyss Queen MD 03/07/2015 9:55 AM

## 2015-03-19 ENCOUNTER — Telehealth: Payer: Self-pay

## 2015-03-19 NOTE — Telephone Encounter (Signed)
Dr. Everlene Farrier, what is the next step for pt?

## 2015-03-19 NOTE — Telephone Encounter (Signed)
Pt was seen here on 10/7 by Dr. Everlene Farrier for knee pain. His knee is hurting more now. He would like a CB on what he can do. Please advise at [phone]

## 2015-03-20 ENCOUNTER — Telehealth: Payer: Self-pay

## 2015-03-20 ENCOUNTER — Telehealth: Payer: Self-pay | Admitting: Emergency Medicine

## 2015-03-20 ENCOUNTER — Other Ambulatory Visit: Payer: Self-pay | Admitting: Emergency Medicine

## 2015-03-20 DIAGNOSIS — M25569 Pain in unspecified knee: Secondary | ICD-10-CM

## 2015-03-20 MED ORDER — NABUMETONE 750 MG PO TABS: ORAL_TABLET | ORAL | Status: DC

## 2015-03-20 MED ORDER — TRAMADOL HCL 50 MG PO TABS: 50.0000 mg | ORAL_TABLET | Freq: Three times a day (TID) | ORAL | Status: DC | PRN

## 2015-03-20 NOTE — Telephone Encounter (Signed)
I told patient to stop his meloxicam. He will start on Relafen 750 twice a day. He has Ultram for breakthrough pain. Referral has been made to orthopedist.

## 2015-03-20 NOTE — Telephone Encounter (Signed)
Pt. Called in today about his Rx that was suppose to be faxed in. The only Rx I saw was tramadol 50 mg and I see Dr. Everlene Farrier printed it today, I called pharmacy to make sure they did not receive fax of Rx and they did not so I called it in. Tried to call Pt. To inform him that it was called in. He was suppose to be at the pharmacy already but left a vm

## 2015-03-20 NOTE — Telephone Encounter (Signed)
Call patient and let him know I placed a referral for him to be seen by the orthopedist regarding his knee pain.

## 2015-03-20 NOTE — Telephone Encounter (Signed)
Patient called checking status from message yesterday. I notified and discussed with  him of Dr. Perfecto Kingdom response. He voiced understanding. He is wanting to know if Dr. Everlene Farrier could change the Meloxicam in the mean time while waiting for appt. He stated knee pain seems to have gotten worse. He still having pain in the front of knee and now having pain at the backside of his knee that goes up on the R side back of thigh. He has been taking Meloxicam everyday as directed and it is not really helping much. Last night, first time, the pain woke him up. He has 3 tabs left. Would like to try something else that might work better. Please advise

## 2016-02-11 ENCOUNTER — Encounter: Payer: Self-pay | Admitting: Medical

## 2016-02-11 ENCOUNTER — Ambulatory Visit (INDEPENDENT_AMBULATORY_CARE_PROVIDER_SITE_OTHER): Payer: Managed Care, Other (non HMO) | Admitting: Medical

## 2016-02-11 VITALS — BP 110/70 | HR 74 | Resp 12 | Ht 69.0 in | Wt 217.0 lb

## 2016-02-11 DIAGNOSIS — Z720 Tobacco use: Secondary | ICD-10-CM

## 2016-02-11 DIAGNOSIS — M7712 Lateral epicondylitis, left elbow: Secondary | ICD-10-CM | POA: Diagnosis not present

## 2016-02-11 DIAGNOSIS — F17200 Nicotine dependence, unspecified, uncomplicated: Secondary | ICD-10-CM

## 2016-02-11 MED ORDER — HYDROCODONE-ACETAMINOPHEN 5-325 MG PO TABS: ORAL_TABLET | ORAL | 0 refills | Status: DC

## 2016-02-11 MED ORDER — IBUPROFEN 800 MG PO TABS: 800.0000 mg | ORAL_TABLET | Freq: Three times a day (TID) | ORAL | 0 refills | Status: DC | PRN

## 2016-02-11 NOTE — Progress Notes (Signed)
Subjective: Chief Complaint  Patient presents with  . Arm Pain    left arm x 1 month hurts worse at night and cant pick up much with left arm   Here as a new patient.  Hasn't had routine primary care in a while, but has seen [facility] on and off.  Saw them recently for his current issue.  He notes in the last few weeks having pain in left arm around elbow.  Hurts with any arm motion using forearm and wrist.   He works 2 jobs.  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  [place] gave him ultram and told to use NSAID but this hasn't helped at all.   He wasn't sure of the diagnosis.  No prior similar.   No recent injury or trauma otherwise.  No other aggravating or relieving factors. No other complaint.  No past medical history on file. ROS as in subjective  Objective: BP 110/70   Pulse 74   Resp 12   Ht 5\' 9"  (1.753 m)   Wt 217 lb (98.4 kg)   SpO2 97%   BMI 32.05 kg/m   Gen: wd, wn, nad, AA male Tender over left arm lateral epicondyle, and lesser tenderness over medial epicondyl.  Pain with forearm ROM and resisted supination and pronation, but the pain mainly over lateral epicondyle.  No swelling, no other tenderness, ROM normal, rest of UE exam unremarkable Arms with normal sensation, strength, normal pulses and cap refill.    Assessment: Encounter Diagnosis  Name Primary?  . Lateral epicondylitis (tennis elbow), left Yes     Plan: Discussed diagnosis, treatment, recommendations and f/u.  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   F/u 2wk.  If not improving, may consider steroid injection, PT, other.    F/u soon for physical  Daniel Stevens was seen today for arm pain.  Diagnoses and all orders for this visit:  Lateral epicondylitis (tennis elbow), left  Smoker  Other  orders -     ibuprofen (ADVIL,MOTRIN) 800 MG tablet; Take 1 tablet (800 mg total) by mouth every 8 (eight) hours as needed. -     HYDROcodone-acetaminophen (NORCO/VICODIN) 5-325 MG tablet; 1 tablet po qhs prn

## 2016-02-29 DIAGNOSIS — E785 Hyperlipidemia, unspecified: Secondary | ICD-10-CM

## 2016-02-29 DIAGNOSIS — E119 Type 2 diabetes mellitus without complications: Secondary | ICD-10-CM

## 2016-02-29 HISTORY — DX: Hyperlipidemia, unspecified: E78.5

## 2016-02-29 HISTORY — DX: Type 2 diabetes mellitus without complications: E11.9

## 2016-03-09 ENCOUNTER — Encounter: Payer: Self-pay | Admitting: Medical

## 2016-03-09 ENCOUNTER — Other Ambulatory Visit: Payer: Self-pay | Admitting: Medical

## 2016-03-09 ENCOUNTER — Ambulatory Visit (INDEPENDENT_AMBULATORY_CARE_PROVIDER_SITE_OTHER): Payer: Managed Care, Other (non HMO) | Admitting: Medical

## 2016-03-09 VITALS — BP 152/100 | HR 79 | Ht 68.0 in | Wt 215.2 lb

## 2016-03-09 DIAGNOSIS — Z1159 Encounter for screening for other viral diseases: Secondary | ICD-10-CM

## 2016-03-09 DIAGNOSIS — Z125 Encounter for screening for malignant neoplasm of prostate: Secondary | ICD-10-CM | POA: Diagnosis not present

## 2016-03-09 DIAGNOSIS — I10 Essential (primary) hypertension: Secondary | ICD-10-CM

## 2016-03-09 DIAGNOSIS — E669 Obesity, unspecified: Secondary | ICD-10-CM

## 2016-03-09 DIAGNOSIS — Z1211 Encounter for screening for malignant neoplasm of colon: Secondary | ICD-10-CM | POA: Diagnosis not present

## 2016-03-09 DIAGNOSIS — Z2821 Immunization not carried out because of patient refusal: Secondary | ICD-10-CM

## 2016-03-09 DIAGNOSIS — Z0000 Encounter for general adult medical examination without abnormal findings: Secondary | ICD-10-CM | POA: Diagnosis not present

## 2016-03-09 DIAGNOSIS — D171 Benign lipomatous neoplasm of skin and subcutaneous tissue of trunk: Secondary | ICD-10-CM | POA: Diagnosis not present

## 2016-03-09 DIAGNOSIS — Z23 Encounter for immunization: Secondary | ICD-10-CM | POA: Diagnosis not present

## 2016-03-09 LAB — CBC
HEMATOCRIT: 40 % (ref 38.5–50.0)
Hemoglobin: 13.5 g/dL (ref 13.2–17.1)
MCH: 30 pg (ref 27.0–33.0)
MCHC: 33.8 g/dL (ref 32.0–36.0)
MCV: 88.9 fL (ref 80.0–100.0)
MPV: 11.2 fL (ref 7.5–12.5)
Platelets: 207 10*3/uL (ref 140–400)
RBC: 4.5 MIL/uL (ref 4.20–5.80)
RDW: 14.3 % (ref 11.0–15.0)
WBC: 6.4 10*3/uL (ref 4.0–10.5)

## 2016-03-09 LAB — PSA: PSA: 1.3 ng/mL (ref <=4.0)

## 2016-03-09 LAB — COMPREHENSIVE METABOLIC PANEL
ALT: 21 U/L (ref 9–46)
AST: 12 U/L (ref 10–35)
Albumin: 4.2 g/dL (ref 3.6–5.1)
Alkaline Phosphatase: 37 U/L — ABNORMAL LOW (ref 40–115)
BUN: 19 mg/dL (ref 7–25)
CHLORIDE: 106 mmol/L (ref 98–110)
CO2: 24 mmol/L (ref 20–31)
Calcium: 9.4 mg/dL (ref 8.6–10.3)
Creat: 1.06 mg/dL (ref 0.70–1.33)
GLUCOSE: 161 mg/dL — AB (ref 65–99)
POTASSIUM: 4.1 mmol/L (ref 3.5–5.3)
Sodium: 138 mmol/L (ref 135–146)
Total Bilirubin: 0.4 mg/dL (ref 0.2–1.2)
Total Protein: 6.6 g/dL (ref 6.1–8.1)

## 2016-03-09 LAB — TSH: TSH: 2.1 m[IU]/L (ref 0.40–4.50)

## 2016-03-09 LAB — LIPID PANEL
Cholesterol: 266 mg/dL — ABNORMAL HIGH (ref 125–200)
HDL: 37 mg/dL — ABNORMAL LOW (ref >=40)
LDL Cholesterol: 162 mg/dL — ABNORMAL HIGH (ref <130)
Total CHOL/HDL Ratio: 7.2 Ratio — ABNORMAL HIGH (ref <=5.0)
Triglycerides: 333 mg/dL — ABNORMAL HIGH (ref <150)
VLDL: 67 mg/dL — AB (ref <30)

## 2016-03-09 LAB — POCT URINALYSIS DIPSTICK
BILIRUBIN UA: NEGATIVE
GLUCOSE UA: NEGATIVE
Ketones, UA: NEGATIVE
LEUKOCYTES UA: NEGATIVE
NITRITE UA: NEGATIVE
Protein, UA: NEGATIVE
RBC UA: NEGATIVE
Spec Grav, UA: >=1.03
Urobilinogen, UA: NEGATIVE
pH, UA: 6

## 2016-03-09 MED ORDER — IBUPROFEN 800 MG PO TABS: 800.0000 mg | ORAL_TABLET | Freq: Three times a day (TID) | ORAL | 0 refills | Status: DC | PRN

## 2016-03-09 MED ORDER — HYDROCODONE-ACETAMINOPHEN 5-325 MG PO TABS: ORAL_TABLET | ORAL | 0 refills | Status: DC

## 2016-03-09 NOTE — Progress Notes (Signed)
Subjective:   HPI  Daniel Stevens is a 55 y.o. male who presents for a complete physical.  Medical care team includes:  Sees dentist, needs to establish with eye doctor  Glade Lloyd, Tawyna Pellot Audelia Acton, PA-C here for primary care   Concerns: Epicondylitis - I saw him a month ago for left lateral epicondylitis.   Still having pains despite relative rest, ice, arms sling, tennis elbow strap, NSAID, pain medication mainly QhS.  It is some what improved.  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    Hasn't been exercising of late.    Trying to work on eating healthy.  has 56mo.    Reviewed their medical, surgical, family, social, medication, and allergy history and updated chart as appropriate.  Past Medical History:  Diagnosis Date  . Former smoker    quit 2016. smoked on and off for years, would quit and start back.  5 pack year hx/o as of 02/2016  . Obesity     Past Surgical History:  Procedure Laterality Date  . COLONOSCOPY     never as of 02/2016  . UMBILICAL HERNIA REPAIR     remote past    Social History   Social History  . Marital status: Single    Spouse name: N/A  . Number of children: N/A  . Years of education: N/A   Occupational History  . Not on file.   Social History Main Topics  . Smoking status: Former Smoker    Packs/day: 0.50    Years: 10.00    Types: Cigarettes    Quit date: 06/22/2013  . Smokeless tobacco: Never Used  . Alcohol use 1.2 oz/week    1 Cans of beer, 1 Glasses of wine per week     Comment: occasionally  . Drug use: No  . Sexual activity: Not Currently   Other Topics Concern  . Not on file   Social History Narrative   Fork lift driving, Hydrologist, plumbing and heating.  Married.  Has 3 children, 2 girls, 1 boy.   As of 02/2016    Family History  Problem Relation Age of Onset  . Diabetes Mother   . Arthritis Mother   . Hypertension Mother   . Other Father     died due to medication error/anesthesia  . Asthma Brother    . Asthma Brother   . Diabetes Brother   . Cancer Neg Hx   . Stroke Neg Hx   . Heart disease Neg Hx      Current Outpatient Prescriptions:  .  HYDROcodone-acetaminophen (NORCO/VICODIN) 5-325 MG tablet, 1 tablet po qhs prn, Disp: 20 tablet, Rfl: 0 .  ibuprofen (ADVIL,MOTRIN) 800 MG tablet, Take 1 tablet (800 mg total) by mouth every 8 (eight) hours as needed., Disp: 30 tablet, Rfl: 0  No Known Allergies   Review of Systems Constitutional: -fever, -chills, -sweats, -unexpected weight change, -decreased appetite, -fatigue Allergy: -sneezing, -itching, -congestion Dermatology: -changing moles, --rash, -lumps ENT: -runny nose, -ear pain, -sore throat, -hoarseness, -sinus pain, -teeth pain, - ringing in ears, -hearing loss, -nosebleeds Cardiology: -chest pain, -palpitations, -swelling, -difficulty breathing when lying flat, -waking up short of breath Respiratory: -cough, -shortness of breath, -difficulty breathing with exercise or exertion, -wheezing, -coughing up blood Gastroenterology: -abdominal pain, -nausea, -vomiting, -diarrhea, -constipation, -blood in stool, -changes in bowel movement, -difficulty swallowing or eating Hematology: -bleeding, -bruising  Musculoskeletal: +joint aches, -muscle aches, -joint swelling, -back pain, -neck pain, -cramping, -changes in gait Ophthalmology: denies vision  changes, eye redness, itching, discharge Urology: -burning with urination, -difficulty urinating, -blood in urine, -urinary frequency, -urgency, -incontinence Neurology: -headache, -weakness, -tingling, -numbness, -memory loss, -falls, -dizziness Psychology: -depressed mood, -agitation, -sleep problems     Objective:   Physical Exam  BP (!) 152/100   Pulse 79   Ht 5\' 8"  (1.727 m)   Wt 215 lb 4 oz (97.6 kg)   SpO2 96%   BMI 32.73 kg/m   BP Readings from Last 3 Encounters:  03/09/16 (!) 152/100  02/11/16 110/70  03/07/15 (!) 146/86   Wt Readings from Last 3 Encounters:  03/09/16  215 lb 4 oz (97.6 kg)  02/11/16 217 lb (98.4 kg)  03/07/15 207 lb 6.4 oz (94.1 kg)    General appearance: alert, no distress, WD/WN, obese AA male Skin: few scattered macules, no worrisome lesions, anterior upper chest slight to left of sternum with 5-6 cm large round spongy fatty lump suggestive of lipoma, unchanged for years. HEENT: normocephalic, conjunctiva/corneas normal, sclerae anicteric, PERRLA, EOMi, nares patent, no discharge or erythema, pharynx normal Oral cavity: MMM, tongue normal, teeth normal Neck: supple, no lymphadenopathy, no thyromegaly, no masses, normal ROM, no bruits Chest: non tender, normal shape and expansion Heart: RRR, normal S1, S2, no murmurs Lungs: CTA bilaterally, no wheezes, rhonchi, or rales Abdomen: +bs, soft, umbilical surgical scar, non tender, non distended, no masses, no hepatomegaly, no splenomegaly, no bruits Back: non tender, normal ROM, no scoliosis Musculoskeletal: still tender over lateral epicondyl, but otherwise upper extremities non tender, no obvious deformity, normal ROM throughout, lower extremities non tender, no obvious deformity, normal ROM throughout Extremities: no edema, no cyanosis, no clubbing Pulses: 2+ symmetric, upper and lower extremities, normal cap refill Neurological: alert, oriented x 3, CN2-12 intact, strength normal upper extremities and lower extremities, sensation normal throughout, DTRs 2+ throughout, no cerebellar signs, gait normal Psychiatric: normal affect, behavior normal, pleasant  GU: normal male external genitalia, circumcised, nontender, no masses, no hernia, no lymphadenopathy Rectal: anus normal tone, prostate WNL, no nodules   Adult ECG Report  Indication: physical, elevated BP  Rate: 69 bpm  Rhythm: normal sinus rhythm  QRS Axis: 31 degrees  PR Interval: 141ms  QRS Duration: 26ms  QTc: 448ms  Conduction Disturbances: none  Other Abnormalities: none  Patient's cardiac risk factors are: advanced age  (older than 37 for men, 2 for women), male gender and obesity (BMI >= 30 kg/m2).  EKG comparison: none  Narrative Interpretation: normal EKG    Assessment and Plan :    Encounter Diagnoses  Name Primary?  . Encounter for health maintenance examination Yes  . Screening for prostate cancer   . Special screening for malignant neoplasms, colon   . Influenza vaccination declined   . Obesity, unspecified classification, unspecified obesity type, unspecified whether serious comorbidity present   . Need for hepatitis C screening test   . Lipoma of anterior chest wall   . Encounter for immunization   . Elevated systolic blood pressure reading with diagnosis of hypertension     Physical exam - discussed healthy lifestyle, diet, exercise, preventative care, vaccinations, and addressed their concerns.   See your eye doctor yearly for routine vision care. See your dentist yearly for routine dental care including hygiene visits twice yearly. Referral for first colonoscopy Prostate screening discussed today obesity - work on efforts at weigh toss thorough diet and exercise Routine labs today Counseled on the influenza virus vaccine.  Vaccine information sheet given.  Influenza vaccine given after consent obtained  Elevated BPs - advised home BP monitoring, discussed goals, f/u 64mo Follow-up pending labs  Kenn was seen today for annual exam.  Diagnoses and all orders for this visit:  Encounter for health maintenance examination -     Comprehensive metabolic panel -     Lipid panel -     PSA -     TSH -     Hemoglobin A1c -     Hepatitis C antibody -     Ambulatory referral to Gastroenterology -     CBC -     Urinalysis Dipstick  Screening for prostate cancer -     PSA  Special screening for malignant neoplasms, colon -     Ambulatory referral to Gastroenterology  Influenza vaccination declined  Obesity, unspecified classification, unspecified obesity type, unspecified whether  serious comorbidity present -     Hemoglobin A1c  Need for hepatitis C screening test -     Hepatitis C antibody  Lipoma of anterior chest wall  Encounter for immunization -     Flu Vaccine QUAD 36+ mos IM  Elevated systolic blood pressure reading with diagnosis of hypertension  Other orders -     HYDROcodone-acetaminophen (NORCO/VICODIN) 5-325 MG tablet; 1 tablet po qhs prn -     ibuprofen (ADVIL,MOTRIN) 800 MG tablet; Take 1 tablet (800 mg total) by mouth every 8 (eight) hours as needed.

## 2016-03-10 LAB — HEPATITIS C ANTIBODY: HCV Ab: NEGATIVE

## 2016-03-10 LAB — HEMOGLOBIN A1C
Hgb A1c MFr Bld: 8.6 % — ABNORMAL HIGH (ref <5.7)
Mean Plasma Glucose: 200 mg/dL

## 2016-03-11 ENCOUNTER — Other Ambulatory Visit: Payer: Self-pay | Admitting: Medical

## 2016-03-11 ENCOUNTER — Encounter: Payer: Self-pay | Admitting: *Deleted

## 2016-03-11 ENCOUNTER — Telehealth: Payer: Self-pay | Admitting: Medical

## 2016-03-11 DIAGNOSIS — M109 Gout, unspecified: Secondary | ICD-10-CM

## 2016-03-11 DIAGNOSIS — M79675 Pain in left toe(s): Principal | ICD-10-CM

## 2016-03-11 DIAGNOSIS — M79674 Pain in right toe(s): Secondary | ICD-10-CM

## 2016-03-11 LAB — URIC ACID: URIC ACID, SERUM: 5.3 mg/dL (ref 4.0–8.0)

## 2016-03-11 NOTE — Telephone Encounter (Signed)
pls have Sandy add Uric acid level

## 2016-03-11 NOTE — Telephone Encounter (Signed)
Gave to [place]

## 2016-03-12 ENCOUNTER — Encounter: Payer: Self-pay | Admitting: Gastroenterology

## 2016-03-16 NOTE — Addendum Note (Signed)
Addended by: Carlena Hurl on: 03/16/2016 09:43 PM   Modules accepted: Orders

## 2016-03-19 ENCOUNTER — Ambulatory Visit (INDEPENDENT_AMBULATORY_CARE_PROVIDER_SITE_OTHER): Payer: Managed Care, Other (non HMO) | Admitting: Medical

## 2016-03-19 ENCOUNTER — Encounter: Payer: Self-pay | Admitting: Medical

## 2016-03-19 VITALS — BP 134/84 | HR 70 | Temp 97.7°F | Ht 68.0 in | Wt 217.6 lb

## 2016-03-19 DIAGNOSIS — E119 Type 2 diabetes mellitus without complications: Secondary | ICD-10-CM | POA: Diagnosis not present

## 2016-03-19 DIAGNOSIS — E782 Mixed hyperlipidemia: Secondary | ICD-10-CM | POA: Diagnosis not present

## 2016-03-19 DIAGNOSIS — E6609 Other obesity due to excess calories: Secondary | ICD-10-CM | POA: Diagnosis not present

## 2016-03-19 MED ORDER — METFORMIN HCL 500 MG PO TABS: 500.0000 mg | ORAL_TABLET | Freq: Two times a day (BID) | ORAL | 5 refills | Status: DC

## 2016-03-19 MED ORDER — BLOOD GLUCOSE MONITOR KIT: PACK | 0 refills | Status: DC

## 2016-03-19 NOTE — Progress Notes (Signed)
Subjective: Chief Complaint  Patient presents with  . Follow-up    Diabetes   Here to discuss new diagnosis of diabetes from his recent physical.  Accompanied by wife and his toddler child.  Past Medical History:  Diagnosis Date  . Diabetes mellitus without complication ([place]) 06/6551  . Former smoker    quit 2016. smoked on and off for years, would quit and start back.  5 pack year hx/o as of 02/2016  . Hyperlipidemia 02/2016  . Obesity    Current Outpatient Prescriptions on File Prior to Visit  Medication Sig Dispense Refill  . HYDROcodone-acetaminophen (NORCO/VICODIN) 5-325 MG tablet 1 tablet po qhs prn 20 tablet 0  . ibuprofen (ADVIL,MOTRIN) 800 MG tablet Take 1 tablet (800 mg total) by mouth every 8 (eight) hours as needed. 30 tablet 0   No current facility-administered medications on file prior to visit.    Family History  Problem Relation Age of Onset  . Diabetes Mother   . Arthritis Mother   . Hypertension Mother   . Other Father     died due to medication error/anesthesia  . Asthma Brother   . Asthma Brother   . Diabetes Brother   . Cancer Neg Hx   . Stroke Neg Hx   . Heart disease Neg Hx    ROS as in subjective  Objective: BP 134/84 (BP Location: Right Arm, Patient Position: Sitting, Cuff Size: Large)   Pulse 70   Temp 97.7 F (36.5 C) (Oral)   Ht 5' 8"  (1.727 m)   Wt 217 lb 9.6 oz (98.7 kg)   SpO2 97%   BMI 33.09 kg/m   gen: wd, wn, nad    Assessment: Encounter Diagnoses  Name Primary?  . Diabetes mellitus, new onset ([place]) Yes  . Mixed dyslipidemia   . Class 1 obesity due to excess calories with serious comorbidity in adult, unspecified BMI     Plan: reivewed his recent labs from physcail  Discussed mixed dyslipidemia  discussed diagnosis of diabetes, diet and exercise recommendations, goals of diabetes care, follow up visits, taking care of dental and eye health, daily foot checks, glucometer testing.  Begin trial of Metformin.  discussed  risks/benefits of metformin.  F/u 68mo sooner prn.  ROnurwas seen today for follow-up.  Diagnoses and all orders for this visit:  Diabetes mellitus, new onset ([place]  Mixed dyslipidemia  Class 1 obesity due to excess calories with serious comorbidity in adult, unspecified BMI  Other orders -     metFORMIN (GLUCOPHAGE) 500 MG tablet; Take 1 tablet (500 mg total) by mouth 2 (two) times daily with a meal. 1 tablet po BID for diabetes -     blood glucose meter kit and supplies KIT; Check daily or when not feeling well

## 2016-03-25 ENCOUNTER — Ambulatory Visit: Payer: Managed Care, Other (non HMO) | Admitting: Family Medicine

## 2016-05-10 ENCOUNTER — Encounter: Payer: Self-pay | Admitting: Gastroenterology

## 2016-06-18 ENCOUNTER — Ambulatory Visit: Payer: Managed Care, Other (non HMO) | Admitting: Medical

## 2016-07-20 ENCOUNTER — Encounter: Payer: Self-pay | Admitting: Gastroenterology

## 2016-10-18 ENCOUNTER — Ambulatory Visit: Payer: Managed Care, Other (non HMO) | Admitting: Medical

## 2016-10-29 ENCOUNTER — Ambulatory Visit: Payer: Managed Care, Other (non HMO) | Admitting: Medical

## 2017-09-19 ENCOUNTER — Emergency Department (HOSPITAL_COMMUNITY)
Admission: EM | Admit: 2017-09-19 | Discharge: 2017-09-19 | Discharge status: Against Medical Advice | Payer: Self-pay | Attending: Emergency Medicine | Admitting: Emergency Medicine

## 2017-09-19 ENCOUNTER — Encounter (HOSPITAL_COMMUNITY): Payer: Self-pay | Admitting: Family Medicine

## 2017-09-19 DIAGNOSIS — R200 Anesthesia of skin: Secondary | ICD-10-CM | POA: Insufficient documentation

## 2017-09-19 DIAGNOSIS — Z5321 Procedure and treatment not carried out due to patient leaving prior to being seen by health care provider: Secondary | ICD-10-CM | POA: Insufficient documentation

## 2017-09-19 DIAGNOSIS — R51 Headache: Secondary | ICD-10-CM | POA: Insufficient documentation

## 2017-09-19 LAB — CBC WITH DIFFERENTIAL/PLATELET
BASOS ABS: 0 10*3/uL (ref 0.0–0.1)
Basophils Relative: 0 %
Eosinophils Absolute: 0.1 10*3/uL (ref 0.0–0.7)
Eosinophils Relative: 1 %
HEMATOCRIT: 40.6 % (ref 39.0–52.0)
HEMOGLOBIN: 13.1 g/dL (ref 13.0–17.0)
Lymphocytes Relative: 35 %
Lymphs Abs: 3.3 10*3/uL (ref 0.7–4.0)
MCH: 30.2 pg (ref 26.0–34.0)
MCHC: 32.3 g/dL (ref 30.0–36.0)
MCV: 93.5 fL (ref 78.0–100.0)
Monocytes Absolute: 0.6 10*3/uL (ref 0.1–1.0)
Monocytes Relative: 6 %
NEUTROS ABS: 5.3 10*3/uL (ref 1.7–7.7)
NEUTROS PCT: 58 %
Platelets: 229 10*3/uL (ref 150–400)
RBC: 4.34 MIL/uL (ref 4.22–5.81)
RDW: 14.8 % (ref 11.5–15.5)
WBC: 9.3 10*3/uL (ref 4.0–10.5)

## 2017-09-19 LAB — COMPREHENSIVE METABOLIC PANEL
ALBUMIN: 4.1 g/dL (ref 3.5–5.0)
ALK PHOS: 42 U/L (ref 38–126)
ALT: 15 U/L — ABNORMAL LOW (ref 17–63)
AST: 14 U/L — AB (ref 15–41)
Anion gap: 10 (ref 5–15)
BILIRUBIN TOTAL: 0.6 mg/dL (ref 0.3–1.2)
BUN: 16 mg/dL (ref 6–20)
CO2: 24 mmol/L (ref 22–32)
Calcium: 9.1 mg/dL (ref 8.9–10.3)
Chloride: 107 mmol/L (ref 101–111)
Creatinine, Ser: 0.96 mg/dL (ref 0.61–1.24)
GFR calc Af Amer: >60 mL/min (ref >60)
GFR calc non Af Amer: >60 mL/min (ref >60)
GLUCOSE: 130 mg/dL — AB (ref 65–99)
POTASSIUM: 3.9 mmol/L (ref 3.5–5.1)
SODIUM: 141 mmol/L (ref 135–145)
Total Protein: 7.6 g/dL (ref 6.5–8.1)

## 2017-09-19 LAB — CBG MONITORING, ED: GLUCOSE-CAPILLARY: 142 mg/dL — AB (ref 65–99)

## 2017-09-19 NOTE — ED Notes (Signed)
Save blue tube in main lab °

## 2017-09-19 NOTE — ED Triage Notes (Signed)
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 This lasted about 2 minutes and went away. This occurred again about 10 minutes a later, lasted about 1 minute, and then went away. Occurred for a third time and lasted about 1 minute. Patient reports he is a diabetic and does not eat apparently. However, he is concerned because this has never happened before. Patient denies chest pain, shortness of breath, or headache. Reports he did become lightheaded. Patient drove himself from work to [facility].

## 2017-09-19 NOTE — ED Notes (Signed)
Spoke with Dr. Madison Hickman about patients condition. He prefer a CBG and labs be drawn while waiting in the lobby.

## 2017-10-18 ENCOUNTER — Encounter: Payer: Managed Care, Other (non HMO) | Admitting: Medical

## 2017-12-14 DIAGNOSIS — M5416 Radiculopathy, lumbar region: Secondary | ICD-10-CM | POA: Diagnosis not present

## 2017-12-14 DIAGNOSIS — M545 Low back pain: Secondary | ICD-10-CM | POA: Diagnosis not present

## 2018-01-18 ENCOUNTER — Ambulatory Visit (INDEPENDENT_AMBULATORY_CARE_PROVIDER_SITE_OTHER): Payer: 59 | Admitting: Medical

## 2018-01-18 ENCOUNTER — Encounter: Payer: Self-pay | Admitting: Medical

## 2018-01-18 ENCOUNTER — Encounter: Payer: Self-pay | Admitting: Gastroenterology

## 2018-01-18 VITALS — BP 120/74 | HR 68 | Temp 97.5°F | Resp 16 | Ht 69.5 in | Wt 203.2 lb

## 2018-01-18 DIAGNOSIS — Z0000 Encounter for general adult medical examination without abnormal findings: Secondary | ICD-10-CM

## 2018-01-18 DIAGNOSIS — D171 Benign lipomatous neoplasm of skin and subcutaneous tissue of trunk: Secondary | ICD-10-CM | POA: Diagnosis not present

## 2018-01-18 DIAGNOSIS — Z125 Encounter for screening for malignant neoplasm of prostate: Secondary | ICD-10-CM

## 2018-01-18 DIAGNOSIS — Z23 Encounter for immunization: Secondary | ICD-10-CM | POA: Insufficient documentation

## 2018-01-18 DIAGNOSIS — Z1211 Encounter for screening for malignant neoplasm of colon: Secondary | ICD-10-CM

## 2018-01-18 DIAGNOSIS — E118 Type 2 diabetes mellitus with unspecified complications: Secondary | ICD-10-CM | POA: Diagnosis not present

## 2018-01-18 DIAGNOSIS — L729 Follicular cyst of the skin and subcutaneous tissue, unspecified: Secondary | ICD-10-CM

## 2018-01-18 DIAGNOSIS — E669 Obesity, unspecified: Secondary | ICD-10-CM

## 2018-01-18 DIAGNOSIS — E785 Hyperlipidemia, unspecified: Secondary | ICD-10-CM | POA: Diagnosis not present

## 2018-01-18 LAB — POCT URINALYSIS DIP (PROADVANTAGE DEVICE)
BILIRUBIN UA: NEGATIVE
GLUCOSE UA: NEGATIVE mg/dL
Ketones, POC UA: NEGATIVE mg/dL
Leukocytes, UA: NEGATIVE
NITRITE UA: NEGATIVE
Protein Ur, POC: NEGATIVE mg/dL
RBC UA: NEGATIVE
Specific Gravity, Urine: 1.02
Urobilinogen, Ur: NEGATIVE
pH, UA: 6 (ref 5.0–8.0)

## 2018-01-18 NOTE — Progress Notes (Signed)
Subjective:   HPI  Daniel Stevens is a 57 y.o. male who presents for a complete physical.  Medical care team includes:  Sees dentist, eye doctor  Raima Geathers, Camelia Eng, PA-C here for primary care   Concerns: Still hasn't had colonoscopy.  No blood in stool, no bowel changes.    Diabetes - checking sugars.   Lately in the 130s.   Hasn't been taken metformin, trying to use diet lifestyle changes.     Has cyst on left upper eyelid for weeks, nothing draining, but wants it resolved.  He tried mashing on it without success  Saw urgent care a few weeks ago for back pain, was given muscle relaxer.  No leg pain or radicular symptoms.    Reviewed their medical, surgical, family, social, medication, and allergy history and updated chart as appropriate.  Past Medical History:  Diagnosis Date  . Diabetes mellitus without complication ([place]) 16/1096  . Former smoker    quit 2016. smoked on and off for years, would quit and start back.  5 pack year hx/o as of 02/2016  . Hyperlipidemia 02/2016  . Obesity     Past Surgical History:  Procedure Laterality Date  . COLONOSCOPY     never as of 02/2016  . UMBILICAL HERNIA REPAIR     remote past    Social History   Socioeconomic History  . Marital status: Single    Spouse name: Not on file  . Number of children: Not on file  . Years of education: Not on file  . Highest education level: Not on file  Occupational History  . Not on file  Social Needs  . Financial resource strain: Not on file  . Food insecurity:    Worry: Not on file    Inability: Not on file  . Transportation needs:    Medical: Not on file    Non-medical: Not on file  Tobacco Use  . Smoking status: Current Some Day Smoker    Packs/day: 0.00    Years: 0.00    Pack years: 0.00    Types: Cigarettes    Last attempt to quit: 06/22/2013    Years since quitting: 4.5  . Smokeless tobacco: Never Used  . Tobacco comment: Patient may smoke once a week, one cigarette.    Substance and Sexual Activity  . Alcohol use: Yes    Comment: Once a week, a beer or glass of wine. Last drink: Last Monday   . Drug use: No  . Sexual activity: Not Currently  Lifestyle  . Physical activity:    Days per week: Not on file    Minutes per session: Not on file  . Stress: Not on file  Relationships  . Social connections:    Talks on phone: Not on file    Gets together: Not on file    Attends religious service: Not on file    Active member of club or organization: Not on file    Attends meetings of clubs or organizations: Not on file    Relationship status: Not on file  . Intimate partner violence:    Fear of current or ex partner: Not on file    Emotionally abused: Not on file    Physically abused: Not on file    Forced sexual activity: Not on file  Other Topics Concern  . 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   As of 12/2017    Family History  Problem Relation Age of Onset  . Diabetes Mother   . Arthritis Mother   . Hypertension Mother   . Other Father        died due to medication error/anesthesia  . Asthma Brother   . Asthma Brother   . Diabetes Brother   . Cancer Neg Hx   . Stroke Neg Hx   . Heart disease Neg Hx      Current Outpatient Medications:  .  ibuprofen (ADVIL,MOTRIN) 800 MG tablet, Take 1 tablet (800 mg total) by mouth every 8 (eight) hours as needed., Disp: 30 tablet, Rfl: 0 .  blood glucose meter kit and supplies KIT, Check daily or when not feeling well (Patient not taking: Reported on 01/18/2018), Disp: 1 each, Rfl: 0 .  metFORMIN (GLUCOPHAGE) 500 MG tablet, Take 1 tablet (500 mg total) by mouth 2 (two) times daily with a meal. 1 tablet po BID for diabetes (Patient not taking: Reported on 01/18/2018), Disp: 60 tablet, Rfl: 5 .  Multiple Vitamin (MULTIVITAMIN) tablet, Take 1 tablet by mouth daily., Disp: , Rfl:   No Known  Allergies   Review of Systems Constitutional: -fever, -chills, -sweats, -unexpected weight change, -decreased appetite, -fatigue Allergy: -sneezing, -itching, -congestion Dermatology: -changing moles, --rash, -lumps ENT: -runny nose, -ear pain, -sore throat, -hoarseness, -sinus pain, -teeth pain, - ringing in ears, -hearing loss, -nosebleeds Cardiology: -chest pain, -palpitations, -swelling, -difficulty breathing when lying flat, -waking up short of breath Respiratory: -cough, -shortness of breath, -difficulty breathing with exercise or exertion, -wheezing, -coughing up blood Gastroenterology: -abdominal pain, -nausea, -vomiting, -diarrhea, -constipation, -blood in stool, -changes in bowel movement, -difficulty swallowing or eating Hematology: -bleeding, -bruising  Musculoskeletal: -joint aches, -muscle aches, -joint swelling, +back pain, -neck pain, -cramping, -changes in gait Ophthalmology: denies vision changes, eye redness, itching, discharge Urology: -burning with urination, -difficulty urinating, -blood in urine, -urinary frequency, -urgency, -incontinence Neurology: -headache, -weakness, -tingling, -numbness, -memory loss, -falls, -dizziness Psychology: -depressed mood, -agitation, -sleep problems     Objective:   Physical Exam  BP 120/74   Pulse 68   Temp (!) 97.5 F (36.4 C) (Oral)   Resp 16   Ht 5' 9.5" (1.765 m)   Wt 203 lb 3.2 oz (92.2 kg)   SpO2 97%   BMI 29.58 kg/m   BP Readings from Last 3 Encounters:  01/18/18 120/74  09/19/17 120/87  03/19/16 134/84   Wt Readings from Last 3 Encounters:  01/18/18 203 lb 3.2 oz (92.2 kg)  09/19/17 206 lb (93.4 kg)  03/19/16 217 lb 9.6 oz (98.7 kg)    General appearance: alert, no distress, WD/WN, obese AA male Skin: few scattered macules, no worrisome lesions, anterior upper chest slight to left of sternum with 5-6 cm large round spongy fatty lump suggestive of lipoma, unchanged for years.  Left upper eyelid with small 3  mm cystic lesion non tender, raised, whitish appearance HEENT: normocephalic, conjunctiva/corneas normal, sclerae anicteric, PERRLA, EOMi, nares patent, no discharge or erythema, pharynx normal Oral cavity: MMM, tongue normal, upper partial plate, otherwise teeth with moderate stain, some decay right upper molar Neck: supple, no lymphadenopathy, no thyromegaly, no masses, normal ROM, no bruits Chest: non tender, normal shape and expansion Heart: RRR, normal S1, S2, no murmurs Lungs: CTA bilaterally, no wheezes, rhonchi, or rales Abdomen: +bs, soft, umbilical surgical scar, non tender, non distended, no masses, no hepatomegaly, no splenomegaly, no bruits Back: non tender, normal ROM, no  scoliosis Musculoskeletal: still tender over lateral epicondyl, but otherwise upper extremities non tender, no obvious deformity, normal ROM throughout, lower extremities non tender, no obvious deformity, normal ROM throughout Extremities: no edema, no cyanosis, no clubbing Pulses: 2+ symmetric, upper and lower extremities, normal cap refill Neurological: alert, oriented x 3, CN2-12 intact, strength normal upper extremities and lower extremities, sensation normal throughout, DTRs 2+ throughout, no cerebellar signs, gait normal Psychiatric: normal affect, behavior normal, pleasant  GU: normal male external genitalia, circumcised, nontender, no masses, no hernia, no lymphadenopathy Rectal: anus normal tone, prostate WNL, no nodules    Assessment and Plan :    Encounter Diagnoses  Name Primary?  . Routine general medical examination at a health care facility Yes  . Lipoma of anterior chest wall   . Type 2 diabetes mellitus with complication, without long-term current use of insulin ([place])   . Screening for prostate cancer   . Screen for colon cancer   . Hyperlipidemia, unspecified hyperlipidemia type   . Need for influenza vaccination   . Cyst of skin   . Obesity, unspecified classification, unspecified  obesity type, unspecified whether serious comorbidity present     Physical exam - discussed healthy lifestyle, diet, exercise, preventative care, vaccinations, and addressed their concerns.   See your eye doctor yearly for routine vision care. See your dentist yearly for routine dental care including hygiene visits twice yearly. Referral for first colonoscopy Prostate screening discussed today obesity - work on efforts at weigh toss thorough diet and exercise Routine labs today Counseled on the influenza virus vaccine.  Vaccine information sheet given.  Influenza vaccine given after consent obtained Cyst of skin -he requested some type of resolution for this lesion.  With his consent I cleaned the area with Betadine, used a 25-gauge insulin needle to make a small hole to unroof the lesion, squeezed out a small amount of cheesy discharge.  He felt immediate relief.  Clean the area and applied a small film of mupirocin ointment.  Discussed wound care.  Call or return if it does not seem to heal up and resolve within the next week Referral to general surgery today for lipoma, referral for colonoscopy He will check insurance coverage for Tdap and Shingrix and pneumococcal vaccine Follow-up pending labs  Darragh was seen today for cpe.  Diagnoses and all orders for this visit:  Routine general medical examination at a health care facility -     POCT Urinalysis DIP (Proadvantage Device) -     Comprehensive metabolic panel -     CBC with Differential/Platelet -     PSA -     Lipid panel -     Hemoglobin A1c -     Microalbumin / creatinine urine ratio -     HM DIABETES FOOT EXAM -     HM DIABETES EYE EXAM -     Ambulatory referral to Gastroenterology  Lipoma of anterior chest wall -     Ambulatory referral to General Surgery  Type 2 diabetes mellitus with complication, without long-term current use of insulin ([facility]) -     Hemoglobin A1c -     Microalbumin / creatinine urine ratio -     HM  DIABETES FOOT EXAM -     HM DIABETES EYE EXAM  Screening for prostate cancer -     PSA  Screen for colon cancer -     Ambulatory referral to Gastroenterology  Hyperlipidemia, unspecified hyperlipidemia type -     Lipid  panel  Need for influenza vaccination -     Flu Vaccine QUAD 6+ mos PF IM (Fluarix Quad PF)  Cyst of skin  Obesity, unspecified classification, unspecified obesity type, unspecified whether serious comorbidity present

## 2018-01-19 ENCOUNTER — Other Ambulatory Visit: Payer: Self-pay | Admitting: Medical

## 2018-01-19 LAB — COMPREHENSIVE METABOLIC PANEL
ALK PHOS: 49 IU/L (ref 39–117)
ALT: 13 IU/L (ref 0–44)
AST: 12 IU/L (ref 0–40)
Albumin/Globulin Ratio: 1.8 (ref 1.2–2.2)
Albumin: 4.4 g/dL (ref 3.5–5.5)
BUN / CREAT RATIO: 13 (ref 9–20)
BUN: 13 mg/dL (ref 6–24)
Bilirubin Total: 0.4 mg/dL (ref 0.0–1.2)
CO2: 22 mmol/L (ref 20–29)
CREATININE: 0.98 mg/dL (ref 0.76–1.27)
Calcium: 9.3 mg/dL (ref 8.7–10.2)
Chloride: 102 mmol/L (ref 96–106)
GFR calc Af Amer: 97 mL/min/{1.73_m2} (ref >59)
GFR calc non Af Amer: 84 mL/min/{1.73_m2} (ref >59)
GLOBULIN, TOTAL: 2.4 g/dL (ref 1.5–4.5)
GLUCOSE: 107 mg/dL — AB (ref 65–99)
Potassium: 4.3 mmol/L (ref 3.5–5.2)
SODIUM: 140 mmol/L (ref 134–144)
Total Protein: 6.8 g/dL (ref 6.0–8.5)

## 2018-01-19 LAB — CBC WITH DIFFERENTIAL/PLATELET
BASOS: 0 %
Basophils Absolute: 0 10*3/uL (ref 0.0–0.2)
EOS (ABSOLUTE): 0.1 10*3/uL (ref 0.0–0.4)
EOS: 1 %
HEMATOCRIT: 41.6 % (ref 37.5–51.0)
HEMOGLOBIN: 13.1 g/dL (ref 13.0–17.7)
Immature Grans (Abs): 0 10*3/uL (ref 0.0–0.1)
Immature Granulocytes: 0 %
LYMPHS ABS: 2.7 10*3/uL (ref 0.7–3.1)
Lymphs: 43 %
MCH: 28.5 pg (ref 26.6–33.0)
MCHC: 31.5 g/dL (ref 31.5–35.7)
MCV: 90 fL (ref 79–97)
MONOCYTES: 6 %
MONOS ABS: 0.4 10*3/uL (ref 0.1–0.9)
Neutrophils Absolute: 3.1 10*3/uL (ref 1.4–7.0)
Neutrophils: 50 %
Platelets: 225 10*3/uL (ref 150–450)
RBC: 4.6 x10E6/uL (ref 4.14–5.80)
RDW: 15.3 % (ref 12.3–15.4)
WBC: 6.3 10*3/uL (ref 3.4–10.8)

## 2018-01-19 LAB — HEMOGLOBIN A1C
ESTIMATED AVERAGE GLUCOSE: 171 mg/dL
Hgb A1c MFr Bld: 7.6 % — ABNORMAL HIGH (ref 4.8–5.6)

## 2018-01-19 LAB — PSA: Prostate Specific Ag, Serum: 2 ng/mL (ref 0.0–4.0)

## 2018-01-19 LAB — LIPID PANEL
CHOL/HDL RATIO: 5.4 ratio — AB (ref 0.0–5.0)
CHOLESTEROL TOTAL: 241 mg/dL — AB (ref 100–199)
HDL: 45 mg/dL (ref >39)
LDL Calculated: 166 mg/dL — ABNORMAL HIGH (ref 0–99)
TRIGLYCERIDES: 149 mg/dL (ref 0–149)
VLDL CHOLESTEROL CAL: 30 mg/dL (ref 5–40)

## 2018-01-19 LAB — MICROALBUMIN / CREATININE URINE RATIO
Creatinine, Urine: 104.5 mg/dL
Microalb/Creat Ratio: <2.9 mg/g creat (ref 0.0–30.0)
Microalbumin, Urine: <3 ug/mL

## 2018-02-22 ENCOUNTER — Other Ambulatory Visit: Payer: Self-pay

## 2018-02-22 ENCOUNTER — Encounter: Payer: Self-pay | Admitting: Gastroenterology

## 2018-02-22 ENCOUNTER — Ambulatory Visit (AMBULATORY_SURGERY_CENTER): Payer: Self-pay

## 2018-02-22 VITALS — Ht 69.0 in | Wt 202.8 lb

## 2018-02-22 DIAGNOSIS — Z1211 Encounter for screening for malignant neoplasm of colon: Secondary | ICD-10-CM

## 2018-02-22 MED ORDER — NA SULFATE-K SULFATE-MG SULF 17.5-3.13-1.6 GM/177ML PO SOLN: 1.0000 | Freq: Once | ORAL | 0 refills | Status: AC

## 2018-02-22 NOTE — Progress Notes (Addendum)
No egg or soy allergy known to patient  No issues with past sedation with any surgeries  or procedures, no intubation problems. Pt verbalize father died from complications of anesthesia, patient father had malignant hyperthermia. No diet pills per patient No home 02 use per patient  No blood thinners per patient  Pt denies issues with constipation  No A fib or A flutter  EMMI video sent to pt's e mail , sent to email

## 2018-02-28 ENCOUNTER — Ambulatory Visit: Payer: 59 | Admitting: Family Medicine

## 2018-02-28 ENCOUNTER — Encounter: Payer: Self-pay | Admitting: Family Medicine

## 2018-02-28 VITALS — BP 134/84 | HR 68 | Ht 69.5 in | Wt 202.4 lb

## 2018-02-28 DIAGNOSIS — R200 Anesthesia of skin: Secondary | ICD-10-CM

## 2018-02-28 DIAGNOSIS — R202 Paresthesia of skin: Secondary | ICD-10-CM | POA: Diagnosis not present

## 2018-02-28 DIAGNOSIS — E118 Type 2 diabetes mellitus with unspecified complications: Secondary | ICD-10-CM | POA: Diagnosis not present

## 2018-02-28 MED ORDER — IBUPROFEN 800 MG PO TABS: 800.0000 mg | ORAL_TABLET | Freq: Three times a day (TID) | ORAL | 0 refills | Status: DC | PRN

## 2018-02-28 NOTE — Progress Notes (Signed)
Chief Complaint  Patient presents with  . Arm Pain    and numbness bilaterally that goes up into his head x couple months. Comes and goes-and is happening more frequently. Twice this am.    Patient presents with concern of short-lived episodes of numbness in both arms, that spreads up to his neck/head.  The episode last night lasted a little longer, got concerned, went to ER, there was 4 hour wait, so he left.  He had 2 episodes this morning, once while brushing his teeth and gargling, and also when jumped up into his truck earlier today. These episodes only lasted 5-10 seconds today, a minute or two last night.  Also happened once last week, while in break room at work.  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 Last night when playing with son, jumped off the couch.  Had worked yesterday.  Starts in the forearms, goes up to the neck and the temples and whole sides of the face get tingling and numb. Both sides equally. No associated neck pain. Some throbbing at the elbows (anteriorly) after the tingling resolves, mostly on the left. Denies any weakness when not having the episode, but while it is occurring, he feels like he can't do anything, afraid it will get worse if he does anything while it is tingling.  No known weakness.  Patient is a diabetic, normally sees [place]. Lab Results  Component Value Date   HGBA1C 7.6 (H) 01/18/2018   DM--sugar was 142 yesterday, didn't check this morning. Denies tingling in fingers/toes, no polydipsia, polyuria.  PMH, PSH, SH reviewed  Outpatient Encounter Medications as of 02/28/2018  Medication Sig Note  . blood glucose meter kit and supplies KIT Check daily or when not feeling well   . cyclobenzaprine (FLEXERIL) 5 MG tablet TAKE 1 TO 2 TABLETS BY MOUTH AT BEDTIME AS NEEDED. TAKE 1 TABLET BY MOUTH EVERY 8 HOURS AS NEEDED DURING WAKING TIME. 02/28/2018: Took 2 last night for back pain, uses prn  . metFORMIN (GLUCOPHAGE) 500 MG  tablet Take 1 tablet (500 mg total) by mouth 2 (two) times daily with a meal. 1 tablet po BID for diabetes   . ibuprofen (ADVIL,MOTRIN) 800 MG tablet Take 1 tablet (800 mg total) by mouth every 8 (eight) hours as needed.   . [DISCONTINUED] ibuprofen (ADVIL,MOTRIN) 800 MG tablet Take 1 tablet (800 mg total) by mouth every 8 (eight) hours as needed. (Patient not taking: Reported on 02/28/2018) 02/28/2018: Last dose last night   No facility-administered encounter medications on file as of 02/28/2018.    (only using ibuprofen sporadically prior to today, took one dose last night)  No Known Allergies  ROS: No fever, chills, bowel/bladder changes. No headaches, dizziness, neck pain. No URI symptoms. Some R sided back/hip pain lately. See HPI.  PHYSICAL EXAM: BP (!) 150/100 (BP Location: Right Arm, Cuff Size: Normal)   Pulse 68   Ht 5' 9.5" (1.765 m)   Wt 202 lb 6.4 oz (91.8 kg)   BMI 29.46 kg/m   134/84 on repeat by MD  Well appearing, mildly anxious male, in no distress HEENT: conjunctiva and sclera are clear, EOMI, OP clear Neck: no lymphadenopathy, c-spine tenderness, thyromegaly or mass Back: Spine nontender, no muscle spasm, no CVA tenderness Minimal tenderness at R SI joint Abdomen: soft, nontender, no organomegaly or mass Extremities: no edema, normal pulses.   Neuro: Minimally tingling with phalen, in forearms, nothing into fingers Negative tinel tap. nontender at  ulnar nerve. Cranial nerves intact, normal strength, sensation, DTR's bilaterally. Normal gait Skin: normal turgor, no rashes Psych: slightly anxious, otherwise normal mood, hygiene, grooming  ASSESSMENT/PLAN:  Numbness and tingling - of both arms, very short-lived episodes. normal neuro exam, reassured no stroke. Ddx reviewed--cervical radiculopathy, peripheral neuropathy, CTS, other - Plan: ibuprofen (ADVIL,MOTRIN) 800 MG tablet  Type 2 diabetes mellitus with complication, without long-term current use of insulin  ([place]) - suboptimally controlled; discuss effect on nerves/tingling. has no f/u scheduled with Audelia Acton, not on statin or ACEI/ARB  Trial of course of NSAID to treat any potential nerve irritation/inflammation (not steroids due to his diabetes). Reassured no e/o stroke. May need EMG/NCV vs imaging of c-spine   Your neurologic exam was normal--normal strength, reflexes. I suspect there may be a nerve that is inflamed, contributing to symptoms.  The question is which nerve--whether it is in the neck related to bone spurs or disks (I doubt, as your exam was normal), versus more peripheral nerves, such as related to carpal tunnel syndrome, or other over-use conditions.  Take ibuprofen 823m three times daily with food for at least a week. You may take it for the full 10 days if your symptoms haven't resolved. Be sure to take it with food. If it bothers your stomach, cut the dose in half or cut back to just twice daily. Do not use any other pain medications--only tylenol is okay. (no Goody/BC, advil, motrin, aleve, naproxen, etc).  Right now the symptoms are very short-lived and your neurologic exam is completely normal (you are not having a stroke). If things change, please contact [facility].  Your diabetes was improving, but still above goal per last check.  Elevated sugars can cause numbness and tingling, so work hard to keep them low (regular exercise, taking your medications, limit your carbs and your sugars in your diet).  F/u SAudelia Actonnext week

## 2018-02-28 NOTE — Patient Instructions (Addendum)
  Your neurologic exam was normal--normal strength, reflexes. I suspect there may be a nerve that is inflamed, contributing to symptoms.  The question is which nerve--whether it is in the neck related to bone spurs or disks (I doubt, as your exam was normal), versus more peripheral nerves, such as related to carpal tunnel syndrome, or other over-use conditions.  Take ibuprofen 800mg  three times daily with food for at least a week. You may take it for the full 10 days if your symptoms haven't resolved. Be sure to take it with food. If it bothers your stomach, cut the dose in half or cut back to just twice daily. Do not use any other pain medications--only tylenol is okay. (no Goody/BC, advil, motrin, aleve, naproxen, etc).  Right now the symptoms are very short-lived and your neurologic exam is completely normal (you are not having a stroke). If things change, please contact us sooner.  Your diabetes was improving, but still above goal per last check. Elevated sugars can cause numbness and tingling, so work hard to keep them low (regular exercise, taking your medications, limit your carbs and your sugars in your diet).

## 2018-03-07 ENCOUNTER — Telehealth: Payer: Self-pay | Admitting: Gastroenterology

## 2018-03-07 ENCOUNTER — Ambulatory Visit: Payer: 59 | Admitting: Medical

## 2018-03-07 NOTE — Telephone Encounter (Signed)
Understood. Thanks for the update.  

## 2018-03-08 ENCOUNTER — Encounter: Payer: 59 | Admitting: Gastroenterology

## 2018-03-08 ENCOUNTER — Ambulatory Visit: Payer: 59 | Admitting: Medical

## 2018-03-16 ENCOUNTER — Ambulatory Visit: Payer: 59 | Admitting: Medical

## 2018-03-30 ENCOUNTER — Other Ambulatory Visit: Payer: Self-pay

## 2018-03-30 ENCOUNTER — Encounter (HOSPITAL_COMMUNITY): Payer: Self-pay

## 2018-03-30 ENCOUNTER — Emergency Department (HOSPITAL_COMMUNITY)
Admission: EM | Admit: 2018-03-30 | Discharge: 2018-03-30 | Disposition: A | Discharge status: Home / Self Care | Payer: 59 | Attending: Emergency Medicine | Admitting: Emergency Medicine

## 2018-03-30 ENCOUNTER — Emergency Department (HOSPITAL_COMMUNITY): Payer: 59

## 2018-03-30 DIAGNOSIS — Z87891 Personal history of nicotine dependence: Secondary | ICD-10-CM | POA: Insufficient documentation

## 2018-03-30 DIAGNOSIS — Z7984 Long term (current) use of oral hypoglycemic drugs: Secondary | ICD-10-CM | POA: Insufficient documentation

## 2018-03-30 DIAGNOSIS — M542 Cervicalgia: Secondary | ICD-10-CM | POA: Insufficient documentation

## 2018-03-30 DIAGNOSIS — R202 Paresthesia of skin: Secondary | ICD-10-CM

## 2018-03-30 DIAGNOSIS — E119 Type 2 diabetes mellitus without complications: Secondary | ICD-10-CM | POA: Insufficient documentation

## 2018-03-30 DIAGNOSIS — R200 Anesthesia of skin: Secondary | ICD-10-CM

## 2018-03-30 DIAGNOSIS — M545 Low back pain: Secondary | ICD-10-CM | POA: Diagnosis not present

## 2018-03-30 DIAGNOSIS — S199XXA Unspecified injury of neck, initial encounter: Secondary | ICD-10-CM | POA: Diagnosis not present

## 2018-03-30 MED ORDER — IBUPROFEN 800 MG PO TABS: 800.0000 mg | ORAL_TABLET | Freq: Once | ORAL | Status: DC

## 2018-03-30 MED ORDER — IBUPROFEN 200 MG PO TABS: 400.0000 mg | ORAL_TABLET | Freq: Three times a day (TID) | ORAL | 0 refills | Status: DC

## 2018-03-30 MED ORDER — TIZANIDINE HCL 2 MG PO CAPS: 2.0000 mg | ORAL_CAPSULE | Freq: Three times a day (TID) | ORAL | 0 refills | Status: DC
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
## 2018-05-26 ENCOUNTER — Ambulatory Visit: Payer: Commercial Managed Care - PPO | Admitting: Medical

## 2018-05-26 ENCOUNTER — Encounter: Payer: Self-pay | Admitting: Medical

## 2018-05-26 VITALS — BP 130/90 | HR 79 | Temp 97.6°F | Resp 16 | Ht 70.0 in | Wt 202.2 lb

## 2018-05-26 DIAGNOSIS — J029 Acute pharyngitis, unspecified: Secondary | ICD-10-CM | POA: Diagnosis not present

## 2018-05-26 DIAGNOSIS — J0110 Acute frontal sinusitis, unspecified: Secondary | ICD-10-CM | POA: Diagnosis not present

## 2018-05-26 DIAGNOSIS — R059 Cough, unspecified: Secondary | ICD-10-CM

## 2018-05-26 DIAGNOSIS — R05 Cough: Secondary | ICD-10-CM

## 2018-05-26 MED ORDER — HYDROCODONE-ACETAMINOPHEN 5-325 MG PO TABS: 1.0000 | ORAL_TABLET | Freq: Four times a day (QID) | ORAL | 0 refills | Status: DC | PRN

## 2018-05-26 MED ORDER — AMOXICILLIN 875 MG PO TABS: 875.0000 mg | ORAL_TABLET | Freq: Two times a day (BID) | ORAL | 0 refills | Status: DC

## 2018-05-26 NOTE — Progress Notes (Signed)
Subjective:  Daniel Stevens is a 57 y.o. male who presents for  Chief Complaint  Patient presents with  . Sinus Problem    sore throat, headache, cough, itchy throat X 1 week   Symptoms include 1 week hx/o sore throat, very painful at times, cough, congestion, sinus pressure, headaches, lots of cough, but no fever, no NVD.   Using mucinex for symptoms. reports sick contacts. Wife has been sick with the same.  No other aggravating or relieving factors.  No other complaint.    Past Medical History:  Diagnosis Date  . Diabetes mellitus without complication ([place]) 81/1914  . Former smoker    quit 2016. smoked on and off for years, would quit and start back.  5 pack year hx/o as of 02/2016  . Hyperlipidemia 02/2016  . Obesity     Current Outpatient Medications on File Prior to Visit  Medication Sig Dispense Refill  . blood glucose meter kit and supplies KIT Check daily or when not feeling well 1 each 0  . ibuprofen (ADVIL,MOTRIN) 200 MG tablet Take 2 tablets (400 mg total) by mouth 3 (three) times daily. 24 tablet 0  . metFORMIN (GLUCOPHAGE) 500 MG tablet Take 1 tablet (500 mg total) by mouth 2 (two) times daily with a meal. 1 tablet po BID for diabetes 60 tablet 5  . tizanidine (ZANAFLEX) 2 MG capsule Take 1 capsule (2 mg total) by mouth 3 (three) times daily. (Patient not taking: Reported on 05/26/2018) 12 capsule 0   No current facility-administered medications on file prior to visit.     ROS as in subjective   Objective: BP 130/90   Pulse 79   Temp 97.6 F (36.4 C) (Oral)   Resp 16   Ht 5' 10"  (1.778 m)   Wt 202 lb 3.2 oz (91.7 kg)   SpO2 97%   BMI 29.01 kg/m   General appearance: Alert, well developed, well nourished, no distress                             Skin: warm, no rash                           Head: +sinus tenderness,                            Eyes: conjunctiva pink, corneas clear                            Ears: flat left tympanic membrane, flat right tympanic  membrane, external ear canals normal                          Nose: septum midline, turbinates swollen, with erythema and clear discharge             Mouth/throat: MMM, tongue normal, mild pharyngeal erythema, no exudate or asymmetry                           Neck: supple,shoddy tender anterior nodes, no thyromegaly, non tender                         Lungs: clear, no wheezes, no rales, no rhonchi        Assessment  Encounter Diagnoses  Name Primary?  . Sore throat Yes  . Acute non-recurrent frontal sinusitis   . Cough       Plan: Discussed diagnosis of sinusitis.   Discussed usual time frame to see improvement. Discussed possible complications or symptoms that would prompt call back or recheck within the next few days.     Medications prescribed:  Complete the course of antibiotic prescribed today.    Patient Instructions  Recommendations:  Drink plenty of water throughout the day to help with secretions  You can use salt water gargles 3 or 4 times a day to remove mucus in the back of her throat and to soothe the throat pain  You can use warm fluids like hot tea or coffee to help with throat pain  You can use Chloraseptic sore throat spray to help with throat pain, and you can use this throughout the day  Begin amoxicillin antibiotic twice daily for 10 days for sinus infection and throat infection  You can use Norco the next few nights to help with sleep and pain  You can continue ibuprofen over-the-counter but take 200 mg over-the-counter, 3 tablets twice daily for the next few days which should help as well  Consider nasal saline flush to help get mucus out of the back of the nostrils and throat  You can use OCT Mucinex DM for congestion and cough     Using Saline Nose Drops with Bulb Syringe A bulb syringe is used to clear your nose. You may use it when you have a stuffy nose, nasal congestion, sinus pressure, or sneezing.   SALINE SOLUTION You can buy nose  drops at your local drug store. You can also make nose drops yourself. Mix 1 cup of water with  teaspoon of salt. Stir. Store this mixture at room temperature. Make a new batch daily.  USE THE BULB IN COMBINATION WITH SALINE NOSE DROPS  Squeeze the air out of the bulb before suctioning the saline mixture.  While still squeezing the bulb flat, place the tip of the bulb into the saline mixture.  Let air come back into the bulb.  This will suction up the saline mixture.  Gently flush one nostril at a time.  Salt water nose drops will then moisten your  congested nose and loosen secretions before suctioning.  Use the bulb syringe as directed below to suction.  USING THE BULB SYRINGE TO SUCTION  While still squeezing the bulb flat, place the tip of the bulb into a nostril. Let air come back into the bulb. The suction will pull snot out of the nose and into the bulb.  Repeat on the other nostril.  Squeeze syringe several times into a tissue.  CLEANING THE BULB SYRINGE  Clean the bulb syringe every day with hot soapy water.  Clean the inside of the bulb by squeezing the bulb while the tip is in soapy water.  Rinse by squeezing the bulb while the tip is in clean hot water.  Store the bulb with the tip side down on paper towel.  HOME CARE INSTRUCTIONS   Use saline nose drops often to keep the nose open and not stuffy.  Throw away used salt water. Make a new solution every time.  Do not use the same solution and dropper for another person  If you do not prefer to use nasal saline flush, other options include nasal saline spray or the [employer], both of which are available over the counter at  your pharmacy.      Patient was advised to call or return if worse or not improving in the next few days.    Patient voiced understanding of diagnosis, recommendations, and treatment plan.  Daniel Stevens was seen today for sinus problem.  Diagnoses and all orders for this visit:  Sore  throat  Acute non-recurrent frontal sinusitis  Cough  Other orders -     amoxicillin (AMOXIL) 875 MG tablet; Take 1 tablet (875 mg total) by mouth 2 (two) times daily. -     HYDROcodone-acetaminophen (NORCO) 5-325 MG tablet; Take 1 tablet by mouth every 6 (six) hours as needed for moderate pain.

## 2018-05-26 NOTE — Patient Instructions (Signed)
Recommendations:  Drink plenty of water throughout the day to help with secretions  You can use salt water gargles 3 or 4 times a day to remove mucus in the back of her throat and to soothe the throat pain  You can use warm fluids like hot tea or coffee to help with throat pain  You can use Chloraseptic sore throat spray to help with throat pain, and you can use this throughout the day  Begin amoxicillin antibiotic twice daily for 10 days for sinus infection and throat infection  You can use Norco the next few nights to help with sleep and pain  You can continue ibuprofen over-the-counter but take 200 mg over-the-counter, 3 tablets twice daily for the next few days which should help as well  Consider nasal saline flush to help get mucus out of the back of the nostrils and throat  You can use OCT Mucinex DM for congestion and cough     Using Saline Nose Drops with Bulb Syringe A bulb syringe is used to clear your nose. You may use it when you have a stuffy nose, nasal congestion, sinus pressure, or sneezing.   SALINE SOLUTION You can buy nose drops at your local drug store. You can also make nose drops yourself. Mix 1 cup of water with  teaspoon of salt. Stir. Store this mixture at room temperature. Make a new batch daily.  USE THE BULB IN COMBINATION WITH SALINE NOSE DROPS  Squeeze the air out of the bulb before suctioning the saline mixture.  While still squeezing the bulb flat, place the tip of the bulb into the saline mixture.  Let air come back into the bulb.  This will suction up the saline mixture.  Gently flush one nostril at a time.  Salt water nose drops will then moisten your  congested nose and loosen secretions before suctioning.  Use the bulb syringe as directed below to suction.  USING THE BULB SYRINGE TO SUCTION  While still squeezing the bulb flat, place the tip of the bulb into a nostril. Let air come back into the bulb. The suction will pull snot out of  the nose and into the bulb.  Repeat on the other nostril.  Squeeze syringe several times into a tissue.  CLEANING THE BULB SYRINGE  Clean the bulb syringe every day with hot soapy water.  Clean the inside of the bulb by squeezing the bulb while the tip is in soapy water.  Rinse by squeezing the bulb while the tip is in clean hot water.  Store the bulb with the tip side down on paper towel.  HOME CARE INSTRUCTIONS   Use saline nose drops often to keep the nose open and not stuffy.  Throw away used salt water. Make a new solution every time.  Do not use the same solution and dropper for another person  If you do not prefer to use nasal saline flush, other options include nasal saline spray or the [employer], both of which are available over the counter at your pharmacy.

## 2018-05-31 DIAGNOSIS — G4730 Sleep apnea, unspecified: Secondary | ICD-10-CM

## 2018-05-31 HISTORY — DX: Sleep apnea, unspecified: G47.30

## 2018-07-18 ENCOUNTER — Ambulatory Visit (INDEPENDENT_AMBULATORY_CARE_PROVIDER_SITE_OTHER): Payer: Commercial Managed Care - PPO | Admitting: Medical

## 2018-07-18 ENCOUNTER — Encounter: Payer: Self-pay | Admitting: Medical

## 2018-07-18 VITALS — BP 130/88 | HR 80 | Temp 98.2°F | Resp 16 | Ht 70.0 in | Wt 198.8 lb

## 2018-07-18 DIAGNOSIS — R6889 Other general symptoms and signs: Secondary | ICD-10-CM

## 2018-07-18 DIAGNOSIS — R112 Nausea with vomiting, unspecified: Secondary | ICD-10-CM

## 2018-07-18 DIAGNOSIS — H6690 Otitis media, unspecified, unspecified ear: Secondary | ICD-10-CM

## 2018-07-18 DIAGNOSIS — R195 Other fecal abnormalities: Secondary | ICD-10-CM

## 2018-07-18 LAB — POCT INFLUENZA A/B
Influenza A, POC: NEGATIVE
Influenza B, POC: NEGATIVE

## 2018-07-18 MED ORDER — PROMETHAZINE HCL 25 MG PO TABS: 25.0000 mg | ORAL_TABLET | Freq: Three times a day (TID) | ORAL | 0 refills | Status: DC | PRN

## 2018-07-18 MED ORDER — AMOXICILLIN 875 MG PO TABS: 875.0000 mg | ORAL_TABLET | Freq: Two times a day (BID) | ORAL | 0 refills | Status: DC

## 2018-07-18 NOTE — Progress Notes (Signed)
Subjective: Chief Complaint  Patient presents with  . sore throat    sore throat, fever, chills vomitting, diarrhea stomach cramping X 07-14-18   Here for 4 day hx/o illness.  Son had illness last week, and thinks he and wife picked it up from him.  Both he and wife got sick on valentines night, both with similar symptoms.  He reports loose stools, nausea, fever up to 100.2, headache, chills, body aches, back pain, joints achy. Some sneezing.   No runny nose, some cough, some sore throat.  No rash.  Had 3 episodes of vomiting.  He notes a few loose stools daily the last few days.  Son is all better now, wife is better now too.  Son had vomiting and loose stools as well.  Son didn't have fever.    No recent travel.  No recent hospital contacts or visitation.  Using ibuprofen.  Worse current symptoms are headache, loose stools, joint aches. No other aggravating or relieving factors. No other complaint.   Past Medical History:  Diagnosis Date  . Diabetes mellitus without complication ([place]) 90/3009  . Former smoker    quit 2016. smoked on and off for years, would quit and start back.  5 pack year hx/o as of 02/2016  . Hyperlipidemia 02/2016  . Obesity    Current Outpatient Medications on File Prior to Visit  Medication Sig Dispense Refill  . ibuprofen (ADVIL,MOTRIN) 200 MG tablet Take 2 tablets (400 mg total) by mouth 3 (three) times daily. 24 tablet 0  . metFORMIN (GLUCOPHAGE) 500 MG tablet Take 1 tablet (500 mg total) by mouth 2 (two) times daily with a meal. 1 tablet po BID for diabetes 60 tablet 5  . blood glucose meter kit and supplies KIT Check daily or when not feeling well (Patient not taking: Reported on 07/18/2018) 1 each 0  . HYDROcodone-acetaminophen (NORCO) 5-325 MG tablet Take 1 tablet by mouth every 6 (six) hours as needed for moderate pain. (Patient not taking: Reported on 07/18/2018) 10 tablet 0  . tizanidine (ZANAFLEX) 2 MG capsule Take 1 capsule (2 mg total) by mouth 3 (three)  times daily. (Patient not taking: Reported on 05/26/2018) 12 capsule 0   No current facility-administered medications on file prior to visit.    ROS as in subjective    Objective: BP 130/88   Pulse 80   Temp 98.2 F (36.8 C) (Oral)   Resp 16   Ht 5' 10"  (1.778 m)   Wt 198 lb 12.8 oz (90.2 kg)   SpO2 96%   BMI 28.52 kg/m   Wt Readings from Last 3 Encounters:  07/18/18 198 lb 12.8 oz (90.2 kg)  05/26/18 202 lb 3.2 oz (91.7 kg)  03/30/18 203 lb (92.1 kg)   General appearance: alert, no distress, WD/WN,  HEENT: normocephalic, sclerae anicteric, left TM flat with erythema, right TM flat, nares patent, no discharge or erythema, pharynx with mild erythema Oral cavity: MMM, no lesions Neck: supple, no lymphadenopathy, no thyromegaly, no masses Heart: RRR, normal S1, S2, no murmurs Lungs: CTA bilaterally, no wheezes, rhonchi, or rales Abdomen: +increased bs, soft, non tender, non distended, no masses, no hepatomegaly, no splenomegaly Pulses: 2+ symmetric, upper and lower extremities, normal cap refill      Assessment: Encounter Diagnoses  Name Primary?  . Ear infection Yes  . Flu-like symptoms   . Nausea and vomiting, intractability of vomiting not specified, unspecified vomiting type   . Loose stools      Plan:  Discussed symptoms, exam findings . Begin medication below, rest, hydrate well, disused usual time to resolve.  F/u within 2-3 days if not much improved.  advised f/u soon for med check.  Ruffin was seen today for sore throat.  Diagnoses and all orders for this visit:  Ear infection  Flu-like symptoms -     Influenza A/B  Nausea and vomiting, intractability of vomiting not specified, unspecified vomiting type  Loose stools  Other orders -     amoxicillin (AMOXIL) 875 MG tablet; Take 1 tablet (875 mg total) by mouth 2 (two) times daily. -     promethazine (PHENERGAN) 25 MG tablet; Take 1 tablet (25 mg total) by mouth every 8 (eight) hours as needed for  nausea or vomiting.

## 2018-07-18 NOTE — Patient Instructions (Addendum)
Stomach flu  Begin Promethazine 1 tablet every 6 hours as needed for nausea and vomiting  don't take aanythingfor lloosestool as it needs to get out of yyourbody  iincrease clear fluids such as Pedialyte, water, ice chips, soup broth etc  Do a brat diet ( banana, rice, applesauce, toast), cracker, jello in small quantities if you have an appetite  Use Ibuprofen 200mg  OTC, 3 tablets twice daily the next few days  Begin Amoxicillin for left ear infection  I would expect symptoms to significantly improve by Friday.  If not, call back

## 2018-07-25 ENCOUNTER — Ambulatory Visit: Payer: Commercial Managed Care - PPO | Admitting: Medical

## 2018-07-25 ENCOUNTER — Telehealth: Payer: Self-pay

## 2018-07-25 VITALS — BP 150/90 | HR 88 | Temp 98.0°F | Resp 16 | Ht 70.0 in | Wt 200.4 lb

## 2018-07-25 DIAGNOSIS — M545 Low back pain, unspecified: Secondary | ICD-10-CM | POA: Insufficient documentation

## 2018-07-25 DIAGNOSIS — E118 Type 2 diabetes mellitus with unspecified complications: Secondary | ICD-10-CM | POA: Diagnosis not present

## 2018-07-25 DIAGNOSIS — G8929 Other chronic pain: Secondary | ICD-10-CM

## 2018-07-25 DIAGNOSIS — M1A9XX0 Chronic gout, unspecified, without tophus (tophi): Secondary | ICD-10-CM

## 2018-07-25 DIAGNOSIS — E669 Obesity, unspecified: Secondary | ICD-10-CM | POA: Diagnosis not present

## 2018-07-25 DIAGNOSIS — F1010 Alcohol abuse, uncomplicated: Secondary | ICD-10-CM

## 2018-07-25 DIAGNOSIS — M25552 Pain in left hip: Secondary | ICD-10-CM

## 2018-07-25 DIAGNOSIS — Z720 Tobacco use: Secondary | ICD-10-CM

## 2018-07-25 DIAGNOSIS — H6690 Otitis media, unspecified, unspecified ear: Secondary | ICD-10-CM | POA: Insufficient documentation

## 2018-07-25 DIAGNOSIS — M25551 Pain in right hip: Secondary | ICD-10-CM | POA: Diagnosis not present

## 2018-07-25 MED ORDER — METFORMIN HCL 500 MG PO TABS: 500.0000 mg | ORAL_TABLET | Freq: Two times a day (BID) | ORAL | 0 refills | Status: DC

## 2018-07-25 MED ORDER — NAPROXEN 500 MG PO TABS: 500.0000 mg | ORAL_TABLET | Freq: Two times a day (BID) | ORAL | 0 refills | Status: DC

## 2018-07-25 NOTE — Progress Notes (Signed)
Subjective: Chief Complaint  Patient presents with  . follow up    pills making him sluggish    Here for recheck  I saw him recent for URI and ear infection.  He is almost completed with Amoxicillin .   He didn't feel right taking promethazine so stopped this.   He notes fatigue, feeling groggy.   His main c/o today is low back pain and right hip pain intermittent, ongoing for weeks.  No fever, no chest pain, no SOB, no abdominal pain.   He notes no injury, no trauma, no fall.   No paresthesias of legs.  He reports taking Metformin.   He reports checking sugars reportedly getting 150's on average.   No other aggravating or relieving factors. No other complaint.  Addendum He called back up after appt and advised recent alcohjol abuse, binge drinking, and note that he uses alcohol to cope with the back pain.   Past Medical History:  Diagnosis Date  . Diabetes mellitus without complication ([facility]) 02/2016  . Former smoker    quit 2016. smoked on and off for years, would quit and start back.  5 pack year hx/o as of 02/2016  . Hyperlipidemia 02/2016  . Obesity    Current Outpatient Medications on File Prior to Visit  Medication Sig Dispense Refill  . amoxicillin (AMOXIL) 875 MG tablet Take 1 tablet (875 mg total) by mouth 2 (two) times daily. 20 tablet 0  . blood glucose meter kit and supplies KIT Check daily or when not feeling well 1 each 0  . ibuprofen (ADVIL,MOTRIN) 200 MG tablet Take 2 tablets (400 mg total) by mouth 3 (three) times daily. 24 tablet 0  . HYDROcodone-acetaminophen (NORCO) 5-325 MG tablet Take 1 tablet by mouth every 6 (six) hours as needed for moderate pain. (Patient not taking: Reported on 07/18/2018) 10 tablet 0  . promethazine (PHENERGAN) 25 MG tablet Take 1 tablet (25 mg total) by mouth every 8 (eight) hours as needed for nausea or vomiting. (Patient not taking: Reported on 07/25/2018) 10 tablet 0  . tizanidine (ZANAFLEX) 2 MG capsule Take 1 capsule (2 mg total) by mouth  3 (three) times daily. (Patient not taking: Reported on 05/26/2018) 12 capsule 0   No current facility-administered medications on file prior to visit.    ROS as in subjective    Objective: BP (!) 150/90   Pulse 88   Temp 98 F (36.7 C) (Oral)   Resp 16   Ht 5' 10" (1.778 m)   Wt 200 lb 6.4 oz (90.9 kg)   SpO2 96%   BMI 28.75 kg/m   Wt Readings from Last 3 Encounters:  07/25/18 200 lb 6.4 oz (90.9 kg)  07/18/18 198 lb 12.8 oz (90.2 kg)  05/26/18 202 lb 3.2 oz (91.7 kg)   BP Readings from Last 3 Encounters:  07/25/18 (!) 150/90  07/18/18 130/88  05/26/18 130/90   General appearance: alert, no distress, WD/WN HEENT: normocephalic, sclerae anicteric, TMs pearly, nares patent, no discharge or erythema, pharynx normal Oral cavity: MMM, no lesions Neck: supple, no lymphadenopathy, no thyromegaly, no masses Heart: RRR, normal S1, S2, no murmurs Lungs: CTA bilaterally, no wheezes, rhonchi, or rales Back: nontender, mild pain with ROM MSK: mild pain in right hip with ROM, ROM limited with internal ROM right hip, otherwise hips nontender, normal ROM otherwise  Abdomen: +bs, soft, non tender, non distended, no masses, no hepatomegaly, no splenomegaly Pulses: 2+ symmetric, upper and lower extremities, normal cap refill Ext:   no edema    Assessment: Encounter Diagnoses  Name Primary?  . Chronic bilateral low back pain, unspecified whether sciatica present Yes  . Bilateral hip pain   . Type 2 diabetes mellitus with complication, without long-term current use of insulin ([place])   . Obesity, unspecified classification, unspecified obesity type, unspecified whether serious comorbidity present   . Current occasional smoker   . Chronic gout without tophus, unspecified cause, unspecified site   . Alcohol abuse   . Acute otitis media, unspecified otitis media type     Plan: We discussed his concerns, symptoms.  chronic back pain, hip pain - go for xrays  Past due for f/u on  diabetes.   He declines labs today.  He states he will return next week, as he reports he doesn't feel that great today.  Refilled Metformin.   He states he has been taking this, and had not taken it for months prior which gave him leftover pills.   Thus, there appears to be quite a bit of noncompliance  Gout - labs next week when he returns.     otitis media - mostly resolved, finish out amoxicillin  Addendum: alcohol abuse - he called up here after his appointment and reported alcohol abuse, recent binge drinking, and requesting help.   We gave him contact info regarding mental health care, but advised he go to [facility] as he is in need of mental health eval, but I suspect given his symptoms, recent excessive alcohol abuse, and the fact he has underlying diabetes, he likely has dangerous glucose levels and could be on verge of crisis.   Recommended ED eval.  He did not appear intoxicated when I saw him today, but he appeared to have some back pain.  He was otherwise cooperative and answers questions appropriately when he was here.  Daniel Stevens was seen today for follow up.  Diagnoses and all orders for this visit:  Chronic bilateral low back pain, unspecified whether sciatica present -     DG HIPS BILAT WITH PELVIS 3-4 VIEWS; Future -     DG Lumbar Spine Complete; Future  Bilateral hip pain -     DG HIPS BILAT WITH PELVIS 3-4 VIEWS; Future -     DG Lumbar Spine Complete; Future  Type 2 diabetes mellitus with complication, without long-term current use of insulin ([facility]) -     Comprehensive metabolic panel; Future -     Hemoglobin A1c; Future  Obesity, unspecified classification, unspecified obesity type, unspecified whether serious comorbidity present -     Comprehensive metabolic panel; Future -     Hemoglobin A1c; Future -     Uric acid; Future  Current occasional smoker  Chronic gout without tophus, unspecified cause, unspecified site -     Uric acid; Future  Alcohol  abuse -     Comprehensive metabolic panel; Future -     Hemoglobin A1c; Future -     Uric acid; Future  Acute otitis media, unspecified otitis media type  Other orders -     metFORMIN (GLUCOPHAGE) 500 MG tablet; Take 1 tablet (500 mg total) by mouth 2 (two) times daily with a meal. 1 tablet po BID for diabetes -     naproxen (NAPROSYN) 500 MG tablet; Take 1 tablet (500 mg total) by mouth 2 (two) times daily with a meal.   F/u for labs and go for xrays

## 2018-07-25 NOTE — Telephone Encounter (Signed)
Patient called back and states that he has been drinking more than a couple of beers.  He has been binge drinking for a little while and wants to know what he can do about it.   States that it helps with the pain.   He has been to [facility] before and that really didn't help him.   Please advise.   Thank you

## 2018-07-25 NOTE — Telephone Encounter (Signed)
If he feels like he is in a crisis he can go to [facility] emergency department.  One thing to consider is that given his diabetes, if he has been consuming significant amounts, then his blood sugars may be sky high.  He told me today his blood sugars have been running in the 150 or less range, but if that is not true then it might be beneficial to get emergency department anyhow  I have often seen before when people are drinking lots of alcohol, blood sugars can get quite high to a critical or dangerous level.  So, if he has actually been checking his blood sugars and if his sugars are running 400+ I would recommend he go to [facility] long hospital  Otherwise, I would strongly recommend he establish with 1 of the counselors I will list below as soon as possible    RESOURCES in [place], [place]  If you are experiencing a mental health crisis or an emergency, please call 911 or go to the nearest emergency department.  [facility]   [phone] [facility]  [phone] [facility]   [phone]   Dr. Launa Flight [address], [place], [place] [address] [phone]   Dr. Chucky May, psychiatry [address], [place], [place] [address] [phone]   [place], [place] [address] [phone]   [facility] [place], [place], [place] [address] [phone]

## 2018-07-26 NOTE — Telephone Encounter (Signed)
Patient notified of all recommendations.

## 2018-12-18 ENCOUNTER — Other Ambulatory Visit: Payer: Self-pay

## 2018-12-18 ENCOUNTER — Ambulatory Visit: Payer: Commercial Managed Care - PPO | Admitting: Medical

## 2018-12-18 ENCOUNTER — Encounter: Payer: Self-pay | Admitting: Medical

## 2018-12-18 VITALS — BP 120/80 | HR 56 | Temp 98.2°F | Resp 16 | Wt 203.4 lb

## 2018-12-18 DIAGNOSIS — E118 Type 2 diabetes mellitus with unspecified complications: Secondary | ICD-10-CM | POA: Diagnosis not present

## 2018-12-18 DIAGNOSIS — R0683 Snoring: Secondary | ICD-10-CM

## 2018-12-18 DIAGNOSIS — E785 Hyperlipidemia, unspecified: Secondary | ICD-10-CM

## 2018-12-18 DIAGNOSIS — G479 Sleep disorder, unspecified: Secondary | ICD-10-CM

## 2018-12-18 DIAGNOSIS — Z6829 Body mass index (BMI) 29.0-29.9, adult: Secondary | ICD-10-CM

## 2018-12-18 DIAGNOSIS — M109 Gout, unspecified: Secondary | ICD-10-CM

## 2018-12-18 NOTE — Patient Instructions (Addendum)
We will set you up for a sleep study through [facility]  diagnostics  Call your insurance to ask about the deductible or co-pay for this   Please pick a time in  the next week to come in for fasting labs as you are due  Work on losing weight through healthy diet and exercise  Exercise: I recommend exercising most days of the week using a type of exercise that you would enjoy and stick to such as walking, running, swimming, hiking, biking, aerobics, etc. This needs to be at least 30-40 minutes at a time, at least 5 days/week with moderate intensity.  This would be 60-70% of your maximum heart rate.  For example, your maximal heart rate would be 200- your age, multiplied x 0.6 to equal 60% of your maximal heart rate.    Thus, a person age 50 would have a maximum heart rate of 160.  60% of this would be 96 beats per minute.  Thus for fat burning exercise, a 60 year old would want to exercise has sustained heart rate of about 96 bpm for fat burning.  However for heart health, they would want to exercise at a higher heart rate of about 75% of maximum heart rate which would be a heart rate of about 120 beats per minute.  So I recommend a combination of doing some exercise at moderate intensity, and some exercise at vigorous intensity for heart health.   Low Carb Diet recommendations:  I recommend you drink water throughout the day.   70 ounces or 2 liters would be a good amount.  If you have been accustomed to drinking juice or soda, try water with lemon or water with lime, or try using no calorie flavor dropper.  I recommend the following as an example meal plan that includes 3 meals per day.   You can skip some meals periodically for intermittent fasting.  Breakfast (choose one): [employer] Omelette with egg, can include a little bit of cheese, your choice of mushrooms, peppers, onions, salsa . Smoothie with handful of spinach or kale, 1 cup of milk or water, 1 cup of berries, 1 packet of artificial sweetener  such as stevia . Yogurt with fruit   Mid morning snack: . 1 fruit serving and 1 protein such as 8 almonds or 8 nuts, or vegetable such as carrots and humus or other similar vegetable   Lunch: . Salad with 3-4 ounces of lean grilled or baked meat such as fish, chicken or [place]   Mid afternoon snack: . 1 fruit serving and 1 protein such as 8 almonds or 8 nuts, or vegetable such as carrots and humus or other similar vegetable   Dinner: . Large serving of vegetables and 3-4 ounces of lean grilled or baked meat such as fish, chicken or [place] . Or vegetarian dish without meat    Avoid  . Chips, cookies, cake, donuts, soda, sweet tea, juices, candy, fast food . For now , avoid, or significantly limit grains    Sleep Apnea Sleep apnea affects breathing during sleep. It causes breathing to stop for a short time or to become shallow. It can also increase the risk of:  Heart attack.  Stroke.  Being very overweight (obese).  Diabetes.  Heart failure.  Irregular heartbeat. The goal of treatment is to help you breathe normally again. What are the causes? There are three kinds of sleep apnea:  Obstructive sleep apnea. This is caused by a blocked or collapsed airway.  Central sleep apnea.  This happens when the brain does not send the right signals to the muscles that control breathing.  Mixed sleep apnea. This is a combination of obstructive and central sleep apnea. The most common cause of this condition is a collapsed or blocked airway. This can happen if:  Your throat muscles are too relaxed.  Your tongue and tonsils are too large.  You are overweight.  Your airway is too small. What increases the risk?  Being overweight.  Smoking.  Having a small airway.  Being older.  Being male.  Drinking alcohol.  Taking medicines to calm yourself (sedatives or tranquilizers).  Having family members with the condition. What are the signs or symptoms?  Trouble  staying asleep.  Being sleepy or tired during the day.  Getting angry a lot.  Loud snoring.  Headaches in the morning.  Not being able to focus your mind (concentrate).  Forgetting things.  Less interest in sex.  Mood swings.  Personality changes.  Feelings of sadness (depression).  Waking up a lot during the night to pee (urinate).  Dry mouth.  Sore throat. How is this diagnosed?  Your medical history.  A physical exam.  A test that is done when you are sleeping (sleep study). The test is most often done in a sleep lab but may also be done at home. How is this treated?   Sleeping on your side.  Using a medicine to get rid of mucus in your nose (decongestant).  Avoiding the use of alcohol, medicines to help you relax, or certain pain medicines (narcotics).  Losing weight, if needed.  Changing your diet.  Not smoking.  Using a machine to open your airway while you sleep, such as: ? An oral appliance. This is a mouthpiece that shifts your lower jaw forward. ? A CPAP device. This device blows air through a mask when you breathe out (exhale). ? An EPAP device. This has valves that you put in each nostril. ? A BPAP device. This device blows air through a mask when you breathe in (inhale) and breathe out.  Having surgery if other treatments do not work. It is important to get treatment for sleep apnea. Without treatment, it can lead to:  High blood pressure.  Coronary artery disease.  In men, not being able to have an erection (impotence).  Reduced thinking ability. Follow these instructions at home: Lifestyle  Make changes that your doctor recommends.  Eat a healthy diet.  Lose weight if needed.  Avoid alcohol, medicines to help you relax, and some pain medicines.  Do not use any products that contain nicotine or tobacco, such as cigarettes, e-cigarettes, and chewing tobacco. If you need help quitting, ask your doctor. General  instructions  Take over-the-counter and prescription medicines only as told by your doctor.  If you were given a machine to use while you sleep, use it only as told by your doctor.  If you are having surgery, make sure to tell your doctor you have sleep apnea. You may need to bring your device with you.  Keep all follow-up visits as told by your doctor. This is important. Contact a doctor if:  The machine that you were given to use during sleep bothers you or does not seem to be working.  You do not get better.  You get worse. Get help right away if:  Your chest hurts.  You have trouble breathing in enough air.  You have an uncomfortable feeling in your back, arms, or stomach.  You have trouble talking.  One side of your body feels weak.  A part of your face is hanging down. These symptoms may be an emergency. Do not wait to see if the symptoms will go away. Get medical help right away. Call your local emergency services (911 in the [place] Do not drive yourself to the hospital. Summary  This condition affects breathing during sleep.  The most common cause is a collapsed or blocked airway.  The goal of treatment is to help you breathe normally while you sleep. This information is not intended to replace advice given to you by your health care provider. Make sure you discuss any questions you have with your health care provider. Document Released: 02/24/2008 Document Revised: 03/03/2018 Document Reviewed: 01/10/2018 Elsevier Patient Education  2020 [employer].

## 2018-12-18 NOTE — Progress Notes (Signed)
Subjective: Chief Complaint  Patient presents with  . sleeping issue    sleeping issues    Here for concerns about sleep.  He has a history significant for diabetes, hyperlipidemia, alcohol abuse including at 07/2018 visit, obesity, former smoker.  He works 1st shift.  He notes that sometimes he can get to sleep initially , sometimes not.   Often will awake up 3am or in middle of night.  Maybe sleeps through the whole night 3 nights per week.  Gets sleepy during the day, +daytime somnolence.  Doesn't feel rested when he awakes.   He snores.  No prior sleep study.  Feels like sinuses are not clear.  Wife thinks he has a sleep problem.   Mom and sister has sleep apnea.  Mom just passed away recent of ovarian cancer.  He notes that he hasn't drank alcohol in 3 months.  He and wife were having some problems.   He notes he went through a detox back after last time we talked.    He is checking glucose, running 150 average.   He ate pizza this morning.   Taking metformin.   Has some right great toe discomfort he attributes to gout   No other c/o.  Past Medical History:  Diagnosis Date  . Diabetes mellitus without complication ([place]) 40/9811  . Former smoker    quit 2016. smoked on and off for years, would quit and start back.  5 pack year hx/o as of 02/2016  . Hyperlipidemia 02/2016  . Obesity    Current Outpatient Medications on File Prior to Visit  Medication Sig Dispense Refill  . blood glucose meter kit and supplies KIT Check daily or when not feeling well 1 each 0  . ibuprofen (ADVIL,MOTRIN) 200 MG tablet Take 2 tablets (400 mg total) by mouth 3 (three) times daily. 24 tablet 0  . metFORMIN (GLUCOPHAGE) 500 MG tablet Take 1 tablet (500 mg total) by mouth 2 (two) times daily with a meal. 1 tablet po BID for diabetes 60 tablet 0  . naproxen (NAPROSYN) 500 MG tablet Take 1 tablet (500 mg total) by mouth 2 (two) times daily with a meal. 30 tablet 0  . HYDROcodone-acetaminophen (NORCO)  5-325 MG tablet Take 1 tablet by mouth every 6 (six) hours as needed for moderate pain. (Patient not taking: Reported on 07/18/2018) 10 tablet 0  . promethazine (PHENERGAN) 25 MG tablet Take 1 tablet (25 mg total) by mouth every 8 (eight) hours as needed for nausea or vomiting. (Patient not taking: Reported on 07/25/2018) 10 tablet 0  . tizanidine (ZANAFLEX) 2 MG capsule Take 1 capsule (2 mg total) by mouth 3 (three) times daily. (Patient not taking: Reported on 05/26/2018) 12 capsule 0   No current facility-administered medications on file prior to visit.    ROS as in subjective    Objective BP 120/80   Pulse (!) 56   Temp 98.2 F (36.8 C) (Oral)   Resp 16   Wt 203 lb 6.4 oz (92.3 kg)   SpO2 98%   BMI 29.18 kg/m   Wt Readings from Last 3 Encounters:  12/18/18 203 lb 6.4 oz (92.3 kg)  07/25/18 200 lb 6.4 oz (90.9 kg)  07/18/18 198 lb 12.8 oz (90.2 kg)   General appearance: alert, no distress, WD/WN,  HEENT: normocephalic, sclerae anicteric, TMs pearly, nares patent, no discharge or erythema, pharynx normal Oral cavity: MMM, no lesions, normal tonsils Neck: supple, no lymphadenopathy, no thyromegaly, no masses Heart: RRR, normal  S1, S2, no murmurs Lungs: CTA bilaterally, no wheezes, rhonchi, or rales Abdomen: +bs, soft, non tender, non distended, no masses, no hepatomegaly, no splenomegaly Pulses: 2+ symmetric, upper and lower extremities, normal cap refill No edema  Diabetic Foot Exam - Simple   Simple Foot Form Diabetic Foot exam was performed with the following findings: Yes 12/18/2018 12:16 PM  Visual Inspection No deformities, no ulcerations, no other skin breakdown bilaterally: Yes Sensation Testing Intact to touch and monofilament testing bilaterally: Yes Pulse Check Posterior Tibialis and Dorsalis pulse intact bilaterally: Yes Comments      Assessment: Encounter Diagnoses  Name Primary?  . Sleep disturbance Yes  . Snoring   . Type 2 diabetes mellitus with  complication, without long-term current use of insulin ([place])   . Hyperlipidemia, unspecified hyperlipidemia type   . BMI 29.0-29.9,adult   . Gout, unspecified cause, unspecified chronicity, unspecified site      Plan We discussed his concerns.  I suspect sleep apnea.  He will return for fasting labs as he is due for labs for routine follow-up on diabetes.  He notes that he quit alcohol after we talked back in February.  I expressed my condolences for the loss of his mother recently.  She passed away of ovarian cancer.  We discussed the need for losing weight, we discussed other measures that can help with sleep apnea  Continue current medications  Counseled on diet and exercise measures.  Corbet was seen today for sleeping issue.  Diagnoses and all orders for this visit:  Sleep disturbance  Snoring  Type 2 diabetes mellitus with complication, without long-term current use of insulin ([facility]) -     Comprehensive metabolic panel; Future -     Hemoglobin A1c; Future  Hyperlipidemia, unspecified hyperlipidemia type -     Lipid panel; Future  BMI 29.0-29.9,adult  Gout, unspecified cause, unspecified chronicity, unspecified site -     Comprehensive metabolic panel; Future -     Uric acid; Future

## 2018-12-25 ENCOUNTER — Other Ambulatory Visit: Payer: Self-pay

## 2018-12-25 DIAGNOSIS — R0683 Snoring: Secondary | ICD-10-CM

## 2018-12-25 DIAGNOSIS — G479 Sleep disorder, unspecified: Secondary | ICD-10-CM

## 2019-01-03 ENCOUNTER — Encounter: Payer: Self-pay | Admitting: Medical

## 2019-01-03 ENCOUNTER — Ambulatory Visit: Payer: Commercial Managed Care - PPO | Admitting: Neurology

## 2019-01-03 ENCOUNTER — Other Ambulatory Visit: Payer: Self-pay

## 2019-01-03 ENCOUNTER — Encounter: Payer: Self-pay | Admitting: Neurology

## 2019-01-03 ENCOUNTER — Ambulatory Visit: Payer: Commercial Managed Care - PPO | Admitting: Medical

## 2019-01-03 VITALS — BP 131/86 | HR 70 | Ht 69.0 in | Wt 204.0 lb

## 2019-01-03 VITALS — BP 130/90 | HR 73 | Temp 97.7°F | Resp 16 | Ht 70.0 in | Wt 204.2 lb

## 2019-01-03 DIAGNOSIS — R351 Nocturia: Secondary | ICD-10-CM | POA: Diagnosis not present

## 2019-01-03 DIAGNOSIS — R0683 Snoring: Secondary | ICD-10-CM | POA: Diagnosis not present

## 2019-01-03 DIAGNOSIS — G4719 Other hypersomnia: Secondary | ICD-10-CM

## 2019-01-03 DIAGNOSIS — M79671 Pain in right foot: Secondary | ICD-10-CM | POA: Diagnosis not present

## 2019-01-03 DIAGNOSIS — E669 Obesity, unspecified: Secondary | ICD-10-CM | POA: Diagnosis not present

## 2019-01-03 DIAGNOSIS — Z820 Family history of epilepsy and other diseases of the nervous system: Secondary | ICD-10-CM

## 2019-01-03 MED ORDER — HYDROCODONE-ACETAMINOPHEN 5-325 MG PO TABS: 1.0000 | ORAL_TABLET | Freq: Four times a day (QID) | ORAL | 0 refills | Status: DC | PRN

## 2019-01-03 NOTE — Patient Instructions (Signed)

## 2019-01-03 NOTE — Progress Notes (Signed)
Subjective:    Patient ID: Daniel Stevens is a 58 y.o. male.  HPI     Star Age, MD, PhD [facility] [address] [place], [place] [address]   Dear Shanon Brow,   I saw your patient, Kabeer Hoagland, upon your kind request to my sleep clinic today for initial consultation of his sleep disturbance, in particular, concern for underlying obstructive sleep apnea.  The patient is unaccompanied today.  As you know, Mr. Hinzman is a 58 year old right-handed gentleman with an underlying medical history of diabetes, hyperlipidemia, gout, prior smoking, and overweight state, who reports snoring and excessive daytime somnolence as well as a family history of sleep apnea.  I reviewed your office note from 12/18/2018. His Epworth sleepiness score is 15 out of 24, fatigue severity score is 55 out of 63.  He reports that his mother has sleep apnea.  He has nocturia about twice per average night, denies recurrent morning headaches.  He works 2 jobs.  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  Bedtime is between 10 and 1130.  His rise time is around 630.  His sleep is interrupted, currently he also has foot pain and heel pain, he has a gout flare on the right side and also right heel pain.  He is working on lifestyle changes, including smoking cessation, alcohol cessation and weight loss.  He quit smoking about 3 to 4 months ago and has not had any alcohol in the past few months.  He does not drink caffeine daily.  He lives with his wife and 88-year-old son.  He has 2 older children in college.  His Past Medical History Is Significant For: Past Medical History:  Diagnosis Date  . Diabetes mellitus without complication ([place]) 33/3832  . Former smoker    quit 2016. smoked on and off for years, would quit and start back.  5 pack year hx/o as of 02/2016  . Hyperlipidemia 02/2016  . Obesity     His Past Surgical History Is  Significant For: Past Surgical History:  Procedure Laterality Date  . UMBILICAL HERNIA REPAIR     remote past    His Family History Is Significant For: Family History  Problem Relation Age of Onset  . Diabetes Mother   . Arthritis Mother   . Hypertension Mother   . Other Father        died due to medication error/anesthesia  . Asthma Brother   . Asthma Brother   . Diabetes Brother   . Cancer Neg Hx   . Stroke Neg Hx   . Heart disease Neg Hx   . Rectal cancer Neg Hx   . Stomach cancer Neg Hx   . Esophageal cancer Neg Hx   . Colon cancer Neg Hx     His Social History Is Significant For: Social History   Socioeconomic History  . Marital status: Single    Spouse name: Not on file  . Number of children: Not on file  . Years of education: Not on file  . Highest education level: Not on file  Occupational History  . Not on file  Social Needs  . Financial resource strain: Not on file  . Food insecurity    Worry: Not on file    Inability: Not on file  . Transportation needs    Medical: Not on file    Non-medical: Not on file  Tobacco Use  .  Smoking status: Former Smoker    Packs/day: 0.00    Years: 0.00    Pack years: 0.00    Types: Cigarettes    Quit date: 06/22/2013    Years since quitting: 5.5  . Smokeless tobacco: Never Used  Substance and Sexual Activity  . Alcohol use: Not Currently    Comment: quit in august 2019  . Drug use: No  . Sexual activity: Not Currently  Lifestyle  . Physical activity    Days per week: Not on file    Minutes per session: Not on file  . Stress: Not on file  Relationships  . Social Herbalist on phone: Not on file    Gets together: Not on file    Attends religious service: Not on file    Active member of club or organization: Not on file    Attends meetings of clubs or organizations: Not on file    Relationship status: Not on file  Other Topics Concern  . Not on file  Social History Narrative   Fork lift  driving, Hydrologist, plumbing and heating.  Married.  Has 3 children, 2 girls, 1 boy.   As of 12/2017    His Allergies Are:  Allergies  Allergen Reactions  . Promethazine     Groggy, excessive drowsiness  :   His Current Medications Are:  Outpatient Encounter Medications as of 01/03/2019  Medication Sig  . blood glucose meter kit and supplies KIT Check daily or when not feeling well  . HYDROcodone-acetaminophen (NORCO) 5-325 MG tablet Take 1 tablet by mouth every 6 (six) hours as needed for moderate pain.  Marland Kitchen ibuprofen (ADVIL,MOTRIN) 200 MG tablet Take 2 tablets (400 mg total) by mouth 3 (three) times daily.  . metFORMIN (GLUCOPHAGE) 500 MG tablet Take 1 tablet (500 mg total) by mouth 2 (two) times daily with a meal. 1 tablet po BID for diabetes  . naproxen (NAPROSYN) 500 MG tablet Take 1 tablet (500 mg total) by mouth 2 (two) times daily with a meal.   No facility-administered encounter medications on file as of 01/03/2019.   :  Review of Systems:  Out of a complete 14 point review of systems, all are reviewed and negative with the exception of these symptoms as listed below: Review of Systems  Neurological:       Pt presents today to discuss his sleep. Pt has never had a sleep study but does endorse snoring.  Epworth Sleepiness Scale 0= would never doze 1= slight chance of dozing 2= moderate chance of dozing 3= high chance of dozing  Sitting and reading: 1 Watching TV: 2 Sitting inactive in a public place (ex. Theater or meeting): 2 As a passenger in a car for an hour without a break: 2 Lying down to rest in the afternoon: 3 Sitting and talking to someone: 2 Sitting quietly after lunch (no alcohol): 2 In a car, while stopped in traffic: 1 Total: 15     Objective:  Neurological Exam  Physical Exam Physical Examination:   Vitals:   01/03/19 1608  BP: 131/86  Pulse: 70    General Examination: The patient is a very pleasant 58 y.o. male in no acute distress. He  appears well-developed and well-nourished and well groomed.   HEENT: Normocephalic, atraumatic, pupils are equal, round and reactive to light and accommodation. Extraocular tracking is good without limitation to gaze excursion or nystagmus noted. Normal smooth pursuit is noted. Hearing is grossly intact. Face is  symmetric with normal facial animation. Speech is clear with no dysarthria noted. There is no hypophonia. There is no lip, neck/head, jaw or voice tremor. Neck is supple with full range of passive and active motion. There are no carotid bruits on auscultation. Oropharynx exam reveals: mild mouth dryness, adequate dental hygiene and moderate airway crowding, due to Redundant soft palate, tonsils of 1+, Mallampati class II, slightly longer appearing uvula.  Tongue protrudes centrally in palate elevates symmetrically.  He has a minimal overbite.  Neck circumference is 16-1/4 inches.   Chest: Clear to auscultation without wheezing, rhonchi or crackles noted.  Heart: S1+S2+0, regular and normal without murmurs, rubs or gallops noted.   Abdomen: Soft, non-tender and non-distended with normal bowel sounds appreciated on auscultation.  Extremities: There is no pitting edema in the distal lower extremities bilaterally. Pedal pulses are intact.  Skin: Warm and dry without trophic changes noted. There are no varicose veins.  Musculoskeletal: exam reveals no obvious joint deformities, tenderness or joint swelling or erythema.   Neurologically:  Mental status: The patient is awake, alert and oriented in all 4 spheres. His immediate and remote memory, attention, language skills and fund of knowledge are appropriate. There is no evidence of aphasia, agnosia, apraxia or anomia. Speech is clear with normal prosody and enunciation. Thought process is linear. Mood is normal and affect is normal.  Cranial nerves II - XII are as described above under HEENT exam. In addition: shoulder shrug is normal with equal  shoulder height noted. Motor exam: Normal bulk, strength and tone is noted. There is no drift, tremor or rebound. Romberg is negative. Reflexes are 2+ in the UEs and LEs. Fine motor skills and coordination: intact with normal finger taps, normal hand movements, normal rapid alternating patting, normal foot taps and normal foot agility.  Cerebellar testing: No dysmetria or intention tremor on finger to nose testing. Heel to shin is unremarkable bilaterally. There is no truncal or gait ataxia.  Sensory exam: intact to light touch in the upper and lower extremities.  Gait, station and balance: He stands easily. No veering to one side is noted. No leaning to one side is noted. Posture is age-appropriate and stance is narrow based. Gait shows normal stride length and normal pace. No problems turning are noted. Tandem walk is unremarkable.   Assessment and Plan:  In summary, Jarell Mcewen is a very pleasant 58 y.o.-year old male with an underlying medical history of diabetes, hyperlipidemia, gout, prior smoking, and overweight state, whose history and physical exam are concerning for obstructive sleep apnea (OSA). I had a long chat with the patient about my findings and the diagnosis of OSA, its prognosis and treatment options. We talked about medical treatments, surgical interventions and non-pharmacological approaches. I explained in particular the risks and ramifications of untreated moderate to severe OSA, especially with respect to developing cardiovascular disease down the Road, including congestive heart failure, difficult to treat hypertension, cardiac arrhythmias, or stroke. Even type 2 diabetes has, in part, been linked to untreated OSA. Symptoms of untreated OSA include daytime sleepiness, memory problems, mood irritability and mood disorder such as depression and anxiety, lack of energy, as well as recurrent headaches, especially morning headaches. We talked about ongoing smoking cessation and trying to  maintain a healthy lifestyle in general, as well as the importance of weight control. I encouraged the patient to eat healthy, exercise daily and keep well hydrated, to keep a scheduled bedtime and wake time routine, to not skip any meals  and eat healthy snacks in between meals. I advised the patient not to drive when feeling sleepy. I recommended the following at this time: sleep study. I explained the sleep test procedure to the patient and also outlined possible surgical and non-surgical treatment options of OSA, including the use of a custom-made dental device (which would require a referral to a specialist dentist or oral surgeon), upper airway surgical options, such as pillar implants, radiofrequency surgery, tongue base surgery, and UPPP (which would involve a referral to an ENT surgeon). Rarely, jaw surgery such as mandibular advancement may be considered.  I also explained the CPAP treatment option to the patient, who indicated that he would be willing to try CPAP if the need arises. I explained the importance of being compliant with PAP treatment, not only for insurance purposes but primarily to improve His symptoms, and for the patient's long term health benefit, including to reduce His cardiovascular risks. I answered all his questions today and the patient was in agreement. I plan to see him back after the sleep study is completed and encouraged him to call with any interim questions, concerns, problems or updates.   Thank you very much for allowing me to participate in the care of this nice patient. If I can be of any further assistance to you please do not hesitate to call me at [phone].  Sincerely,   Star Age, MD, PhD

## 2019-01-03 NOTE — Progress Notes (Signed)
Subjective:  Daniel Stevens is a 58 y.o. male who presents for Chief Complaint  Patient presents with  . heel pain    right heel pain X last night     Here today for right heel pain.  Started last night, could not sleep all night due to pain.  He denies fall, no trauma no specific injury but he was digging holes in the garden last night for his wife to plant some plants.  He denies any pain in the bottom of the foot.  No swelling no obvious redness or bruising.  No numbness or tingling.  He does have a history of gout typically in the big toe and this feels different.  He used Naprosyn last night but that did not help at all.  No fever, no other complaint.  The following portions of the patient's history were reviewed and updated as appropriate: allergies, current medications, past family history, past medical history, past social history, past surgical history and problem list.  ROS Otherwise as in subjective above  Objective: BP 130/90   Pulse 73   Temp 97.7 F (36.5 C) (Temporal)   Resp 16   Ht 5\' 10"  (1.778 m)   Wt 204 lb 3.2 oz (92.6 kg)   SpO2 98%   BMI 29.30 kg/m   General appearance: alert, no distress, well developed, well nourished Tender over right posterior lateral heel point tenderness, no bruising, no redness, no warmth, no swelling No other foot tenderness no tenderness over the plantar fascia or Achilles tendon, no tenderness over the toes or anterior foot, no ankle deformity swelling or tenderness, mild pain in the focal area of the heel with plantar flexion, otherwise normal range of motion of ankle and toes without pain Feet neurovascularly intact   Assessment: Encounter Diagnosis  Name Primary?  . Pain of right heel Yes     Plan: I suspect he bruised his heel working out in the garden last night but we also discussed the possibility of heel spur.  I doubt fracture given no trauma or fall.  He can use short-term pain medicine as below since the Naprosyn did  not help.  Advised ice, relative rest, stay off the foot when possible the next few days, can use heel cups as discussed over-the-counter.  Daniel Stevens was seen today for heel pain.  Diagnoses and all orders for this visit:  Pain of right heel  Other orders -     HYDROcodone-acetaminophen (NORCO) 5-325 MG tablet; Take 1 tablet by mouth every 6 (six) hours as needed for moderate pain.    Follow up: prn

## 2019-01-28 ENCOUNTER — Ambulatory Visit (INDEPENDENT_AMBULATORY_CARE_PROVIDER_SITE_OTHER): Payer: Commercial Managed Care - PPO | Admitting: Neurology

## 2019-01-28 DIAGNOSIS — G4733 Obstructive sleep apnea (adult) (pediatric): Secondary | ICD-10-CM

## 2019-01-28 DIAGNOSIS — R351 Nocturia: Secondary | ICD-10-CM

## 2019-01-28 DIAGNOSIS — R0683 Snoring: Secondary | ICD-10-CM

## 2019-01-28 DIAGNOSIS — E669 Obesity, unspecified: Secondary | ICD-10-CM

## 2019-01-28 DIAGNOSIS — G4720 Circadian rhythm sleep disorder, unspecified type: Secondary | ICD-10-CM

## 2019-01-28 DIAGNOSIS — G4719 Other hypersomnia: Secondary | ICD-10-CM

## 2019-01-28 DIAGNOSIS — Z820 Family history of epilepsy and other diseases of the nervous system: Secondary | ICD-10-CM

## 2019-02-01 NOTE — Progress Notes (Signed)
Patient referred by Chana Bode, PA, seen by me on 01/03/19, diagnostic PSG on 01/28/19.   Please call and notify the patient that the recent sleep study showed mild to moderate obstructive sleep apnea and I recommend treatment for this in the form of CPAP. This will require a repeat sleep study for proper titration and mask fitting and correct monitoring of the oxygen saturations. Please explain to patient. I have placed an order in the chart. Thanks.  Star Age, MD, PhD [facility])

## 2019-02-01 NOTE — Addendum Note (Signed)
Addended by: Star Age on: 02/01/2019 07:23 PM   Modules accepted: Orders

## 2019-02-01 NOTE — Procedures (Signed)
PATIENT'S NAME:  Daniel Stevens, Daniel Stevens DOB:      1961/01/01      MR#:    [id number]     DATE OF RECORDING: 01/28/2019 REFERRING M.D.:  Chana Bode, PA Study Performed:   Baseline Polysomnogram HISTORY: 58 year old man with a history of diabetes, hyperlipidemia, gout, prior smoking, and overweight state, who reports snoring and excessive daytime somnolence as well as a family history of sleep apnea. The patient endorsed the Epworth Sleepiness Scale at 15 points. The patient's weight 205 pounds with a height of 69 (inches), resulting in a BMI of 30.4 kg/m2. The patient's neck circumference measured 16.25 inches.  CURRENT MEDICATIONS: Norco, Advil, Metformin, Naproxen   PROCEDURE:  This is a multichannel digital polysomnogram utilizing the Somnostar 11.2 system.  Electrodes and sensors were applied and monitored per AASM Specifications.   EEG, EOG, Chin and Limb EMG, were sampled at 200 Hz.  ECG, Snore and Nasal Pressure, Thermal Airflow, Respiratory Effort, CPAP Flow and Pressure, Oximetry was sampled at 50 Hz. Digital video and audio were recorded.      BASELINE STUDY  Lights Out was at 22:44 and Lights On at 04:59.  Total recording time (TRT) was 375.5 minutes, with a total sleep time (TST) of 234 minutes.   The patient's sleep latency was 20 minutes.  REM latency was 88 minutes, which is normal. The sleep efficiency was 62.3%, which is reduced.     SLEEP ARCHITECTURE: WASO (Wake after sleep onset) was 95.5 minutes with moderate sleep fragmentation noted. There were 44.5 minutes in Stage N1, 65.5 minutes Stage N2, 54.5 minutes Stage N3 and 69.5 minutes in Stage REM.  The percentage of Stage N1 was 19.%, which is increased, Stage N2 was 28.%, Stage N3 was 23.3% and Stage R (REM sleep) was 29.7%, which is mildly increased.  RESPIRATORY ANALYSIS:  There were a total of 56 respiratory events:  4 obstructive apneas, 0 central apneas and 4 mixed apneas with a total of 8 apneas and an apnea index (AI) of 2.1  /hour. There were 48 hypopneas with a hypopnea index of 12.3 /hour. The patient also had 0 respiratory event related arousals (RERAs).      The total APNEA/HYPOPNEA INDEX (AHI) was 14.4 /hour and the total RESPIRATORY DISTURBANCE INDEX was 0. 14.4 /hour.  13 events occurred in REM sleep and 74 events in NREM. The REM AHI was 11.2 /hour, versus a non-REM AHI of 15.7. The patient spent 126 minutes of total sleep time in the supine position and 108 minutes in non-supine.. The supine AHI was 24.8 versus a non-supine AHI of 2.2.  OXYGEN SATURATION & C02:  The Wake baseline 02 saturation was 94%, with the lowest being 80% (77% on the technical report was sensor error). Time spent below 89% saturation equaled 51 minutes (likely overestimated).  PERIODIC LIMB MOVEMENTS: The patient had a total of 0 Periodic Limb Movements.  The Periodic Limb Movement (PLM) index was 0 and the PLM Arousal index was 0/hour. The arousals were noted as: 59 were spontaneous, 0 were associated with PLMs, 32 were associated with respiratory events.  Audio and video analysis did not show any abnormal or unusual movements, behaviors, phonations or vocalizations. The patient took 1 bathroom break. Snoring was noted, ranging from mild to loud. The EKG was in keeping with normal sinus rhythm (NSR). Post-study, the patient indicated that sleep was the same as usual.   IMPRESSION: 1. Obstructive Sleep Apnea OSA) 2. Dysfunctions associated with sleep stages or arousal from  sleep  RECOMMENDATIONS: 1. This study demonstrates overall mild/near-moderate obstructive sleep apnea, moderate during supine sleep with a total AHI of 14.4/hour, REM AHI of 11.2/hour, supine AHI of 24.8/hour and O2 nadir of 80%. Given the patient's medical history and sleep related complaints, a treatment with positive airway pressure is advised; a full-night CPAP titration study is recommended to optimize therapy. Other treatment options may include avoidance of supine  sleep position along with weight loss, upper airway or jaw surgery in selected patients or the use of an oral appliance in certain patients. ENT evaluation and/or consultation with a maxillofacial surgeon or dentist may be feasible in some instances. Please note that untreated obstructive sleep apnea may carry additional perioperative morbidity. Patients with significant obstructive sleep apnea (typically, in the moderate to severe degree) should receive, if possible, perioperative PAP (positive airway pressure) therapy and the surgeons and particularly the anesthesiologists should be informed of the diagnosis and the severity of the sleep disordered breathing. If feasible, the patient may be asked to bring his/her CPAP or BiPAP machine for a planned/elective surgery.  2. This study shows sleep fragmentation and abnormal sleep stage percentages; these are nonspecific findings and per se do not signify an intrinsic sleep disorder or a cause for the patient's sleep-related symptoms. Causes include (but are not limited to) the first night effect of the sleep study, circadian rhythm disturbances, medication effect or an underlying mood disorder or medical problem.  3. The patient should be cautioned not to drive, work at heights, or operate dangerous or heavy equipment when tired or sleepy. Review and reiteration of good sleep hygiene measures should be pursued with any patient. 4. The patient will be seen in follow-up in the sleep clinic at [facility] for discussion of the test results, symptom and treatment compliance review, further management strategies, etc. The referring provider will be notified of the test results.  I certify that I have reviewed the entire raw data recording prior to the issuance of this report in accordance with the Standards of Accreditation of the American Academy of Sleep Medicine (AASM)    Star Age, MD, PhD Diplomat, American Board of Neurology and Sleep Medicine (Neurology and Sleep  Medicine)

## 2019-02-06 ENCOUNTER — Telehealth: Payer: Self-pay | Admitting: Neurology

## 2019-02-06 NOTE — Telephone Encounter (Signed)
I called Daniel Stevens. I advised Daniel Stevens that Dr. Rexene Alberts reviewed their sleep study results and found that has mild to moderate apnea and recommends that Daniel Stevens be treated with a cpap. Dr. Rexene Alberts recommends that Daniel Stevens return for a repeat sleep study in order to properly titrate the cpap and ensure a good mask fit. Daniel Stevens is agreeable to returning for a titration study. I advised Daniel Stevens that our sleep lab will file with Daniel Stevens's insurance and call Daniel Stevens to schedule the sleep study when we hear back from the Daniel Stevens's insurance regarding coverage of this sleep study. Daniel Stevens verbalized understanding of results. Daniel Stevens had no questions at this time but was encouraged to call back if questions arise.    From Dr Rexene Alberts, "Patient referred by Chana Bode, PA, seen by me on 01/03/19, diagnostic PSG on 01/28/19.   Please call and notify the patient that the recent sleep study showed mild to moderate obstructive sleep apnea and I recommend treatment for this in the form of CPAP. This will require a repeat sleep study for proper titration and mask fitting and correct monitoring of the oxygen saturations. Please explain to patient. I have placed an order in the chart. Thanks"

## 2019-02-14 ENCOUNTER — Ambulatory Visit: Payer: Commercial Managed Care - PPO | Admitting: Medical

## 2019-02-15 ENCOUNTER — Other Ambulatory Visit: Payer: Self-pay

## 2019-02-15 ENCOUNTER — Encounter: Payer: Self-pay | Admitting: Family Medicine

## 2019-02-15 ENCOUNTER — Ambulatory Visit (INDEPENDENT_AMBULATORY_CARE_PROVIDER_SITE_OTHER): Payer: Commercial Managed Care - PPO | Admitting: Family Medicine

## 2019-02-15 VITALS — BP 137/90 | Wt 192.0 lb

## 2019-02-15 DIAGNOSIS — J029 Acute pharyngitis, unspecified: Secondary | ICD-10-CM

## 2019-02-15 DIAGNOSIS — R059 Cough, unspecified: Secondary | ICD-10-CM

## 2019-02-15 DIAGNOSIS — R05 Cough: Secondary | ICD-10-CM | POA: Diagnosis not present

## 2019-02-15 NOTE — Patient Instructions (Signed)
Please send [place] a Mychart Message once you have your glasses, so that you can let me know the ingredients in the Mucinex that you have at home.  I don't want to give you bad directions, thinking you have one kind, when you might have a different kind.  I don't want to overlap ingredients between medications, and risk you getting too much of a medication.  The medications that I think might help you include: An antihistamine such as claritin or allegra or zyrtec--this might dry up any postnasal drainage (without raising your blood pressure). Guaifenesin (expectorant) and dextromethorphan (cough suppressant)--these are usually found in robitussin DM and mucinex DM. DM is also found separately in Delsym Syrup.  Be sure that you aren't getting dextromethorphan from more than 1 medication that you are taking, it is dangerous to take too much of it.  I also recommend continuing with salt water gargles. You can try Chloraseptic spray to see if it helps with your throat discomfort. I think your throat pain is worsened by your coughing, so treating the cough should help. You can also use tylenol for pain. Tylenol can be used in addition to advil OR naproxen (but don't use advil and naproxen together as you did).  Be sure to take medications only as prescribed (don't double up on prescription naproxen, take only one at a time, and NOT with ibuprofen).  Taking too much, or both, can put you at risk for an ulcer or GI bleed.  If you develop fever, swollen glands, worsening sore throat where you cannot swallow, rash, or other symptoms, you will need to be re-evaluated. Do not continue to take the leftover amoxicillin.   Pharyngitis  Pharyngitis is a sore throat (pharynx). This is when there is redness, pain, and swelling in your throat. Most of the time, this condition gets better on its own. In some cases, you may need medicine. Follow these instructions at home:  Take over-the-counter and prescription  medicines only as told by your doctor. ? If you were prescribed an antibiotic medicine, take it as told by your doctor. Do not stop taking the antibiotic even if you start to feel better. ? Do not give children aspirin. Aspirin has been linked to Reye syndrome.  Drink enough water and fluids to keep your pee (urine) clear or pale yellow.  Get a lot of rest.  Rinse your mouth (gargle) with a salt-water mixture 3-4 times a day or as needed. To make a salt-water mixture, completely dissolve -1 tsp of salt in 1 cup of warm water.  If your doctor approves, you may use throat lozenges or sprays to soothe your throat. Contact a doctor if:  You have large, tender lumps in your neck.  You have a rash.  You cough up green, yellow-brown, or bloody spit. Get help right away if:  You have a stiff neck.  You drool or cannot swallow liquids.  You cannot drink or take medicines without throwing up.  You have very bad pain that does not go away with medicine.  You have problems breathing, and it is not from a stuffy nose.  You have new pain and swelling in your knees, ankles, wrists, or elbows. Summary  Pharyngitis is a sore throat (pharynx). This is when there is redness, pain, and swelling in your throat.  If you were prescribed an antibiotic medicine, take it as told by your doctor. Do not stop taking the antibiotic even if you start to feel better.  Most  of the time, pharyngitis gets better on its own. Sometimes, you may need medicine. This information is not intended to replace advice given to you by your health care provider. Make sure you discuss any questions you have with your health care provider. Document Released: 11/03/2007 Document Revised: 04/29/2017 Document Reviewed: 06/22/2016 Elsevier Patient Education  2020 [employer].

## 2019-02-15 NOTE — Progress Notes (Signed)
Start time: 11:52 End time: 12:32  Virtual Visit via Video Note  I connected with Daniel Stevens on 02/15/19 by a video enabled telemedicine application and verified that I am speaking with the correct person using two identifiers.  Location: Patient: alone in hotel room Provider: office   I discussed the limitations of evaluation and management by telemedicine and the availability of in person appointments. The patient expressed understanding and agreed to proceed.  History of Present Illness:  Chief Complaint  Patient presents with  . cough and sore throat    cough and sore throat for the last 2 days. red in back of throat and hurts to swallow   Monday or Tuesday (2-3 days ago) he had some slight throat irritation and cough.  Hurts to swallow, wakes him up from sleep. Throat is itchy and scratchy, and worse at night.   He has been coughing more.  Initially he coughed up some phlegm (green), but that stopped, none in 1-2 days.  Cough now is dry. Denies allergies. Denies chest congestion. Denies sinus pain.  He has some chronic congestion in his nose, hard to breathe out of his nose for years.  Denies any ear plugging or pain. Denies swollen glands.  No sick contacts.   Took an ibuprofen at work this morning (2, OTC) He reports he also took Amoxil he had left over from root canal, from dentist--took 2 yest, took 2 this morning (573m, directions state 1 TID).  He also has a bottle of Hydrocodone from dentist--NOT taking Has a white pill 32C --? Naproxen.  Took 2 last night. (chart has rx naproxen 5061m he thinks is a rx med, NOT OTC).  Bought a cough and throat relief--- Honey, Vit C, vit A---trouble reading the label Patient is staying in a hotel, doesn't have his glasses with him, hard to read the labels. He also has some mucinex, thinks it might be D because he recalls showing his license.  He can read the 60065m He hasn't taken this yet.  Recently diagnosed with OSA, hasn't  started CPAP yet.  PMH, PSH, SH reviewed  Outpatient Encounter Medications as of 02/15/2019  Medication Sig  . blood glucose meter kit and supplies KIT Check daily or when not feeling well  . ibuprofen (ADVIL,MOTRIN) 200 MG tablet Take 2 tablets (400 mg total) by mouth 3 (three) times daily.  . metFORMIN (GLUCOPHAGE) 500 MG tablet Take 1 tablet (500 mg total) by mouth 2 (two) times daily with a meal. 1 tablet po BID for diabetes  . naproxen (NAPROSYN) 500 MG tablet Take 1 tablet (500 mg total) by mouth 2 (two) times daily with a meal.  . [DISCONTINUED] HYDROcodone-acetaminophen (NORCO) 5-325 MG tablet Take 1 tablet by mouth every 6 (six) hours as needed for moderate pain. (Patient not taking: Reported on 02/15/2019)   No facility-administered encounter medications on file as of 02/15/2019.    Other OTC/recent meds per HPI  Allergies  Allergen Reactions  . Promethazine     Groggy, excessive drowsiness   ROS: Denies headaches, dizziness, fever, chills, bleeding, bruising, nausea, vomiting, diarrhea, joint pains or body aches. Just sore throat and cough, per HPI.    Observations/Objective:  BP 137/90   Wt 192 lb (87.1 kg)   BMI 28.35 kg/m  Well-appearing, pleasant male, in no distress. He appears comfortable, is speaking easily. No cough or throat clearing during the visit. HEENT conjunctiva and sclera appear clear and CN are grossly intact.  OP exam--without tongue  depressor, only incompletely visualized.  Visualized portion showed just mild erythema.  Didn't see tonsillar enlargement or exudates, no lesions/sores Neck: appears normal.  Pt denies feeling any sore spots on palpating neck Exam limited due to virtual nature of the visit.   Assessment and Plan:  Pharyngitis, unspecified etiology - Ddx reviewed with pt.  Sx not c/w strep (given cough, lack of fever, etc). Stop taking old amoxil. Supportive measures reviewed.  f/u if worse  Cough - ddx reviewed. OTC ingredients reviewed  in detail for what he can try and safely take. Will need to read ingredients when has glasses. f/u if worsening  Chloraseptic spray, salt water gargles Tylenol vs ibuprofen OR naproxen Antihistamines mucinex DM or Robitussin DM BP a little high today, avoid decongestants.  When he has his glasses, can send MyChart message with ingredients for better recommendation of OTC meds.   Follow Up Instructions:     I discussed the assessment and treatment plan with the patient. The patient was provided an opportunity to ask questions and all were answered. The patient agreed with the plan and demonstrated an understanding of the instructions.   The patient was advised to call back or seek an in-person evaluation if the symptoms worsen or if the condition fails to improve as anticipated.  I provided 42 minutes of non-face-to-face time during this encounter. Considerable time spent trying to figure out what he has actually taken medication-wise, counseling against taking meds different than prescribed. Video call dropped about 2/3 through the visit, and completed over the phone.   Vikki Ports, MD

## 2019-03-13 ENCOUNTER — Inpatient Hospital Stay (HOSPITAL_COMMUNITY): Admission: RE | Admit: 2019-03-13 | Payer: Self-pay | Source: Ambulatory Visit

## 2019-03-14 ENCOUNTER — Telehealth: Payer: Self-pay | Admitting: Medical

## 2019-03-14 NOTE — Telephone Encounter (Signed)
Pt called and wanted to know if you could prescribed something for him or recommend something over the counter that he can take to not crave alcohol. Pt can be reached at [phone]

## 2019-03-15 ENCOUNTER — Other Ambulatory Visit: Payer: Self-pay | Admitting: Medical

## 2019-03-15 MED ORDER — NALTREXONE HCL 50 MG PO TABS: 25.0000 mg | ORAL_TABLET | Freq: Every day | ORAL | 0 refills | Status: DC

## 2019-03-15 NOTE — Telephone Encounter (Signed)
Pt informed. He is at work now and will call back later to schedule appt.

## 2019-03-15 NOTE — Telephone Encounter (Signed)
I sent a medication called naltrexone to his pharmacy today can start 1/2 tablet daily or 25 mg daily.  I would like to see him back in a couple weeks to see how this is doing

## 2019-03-15 NOTE — Progress Notes (Signed)
naltrex

## 2019-03-16 ENCOUNTER — Encounter: Payer: Self-pay | Admitting: Family Medicine

## 2019-03-16 ENCOUNTER — Ambulatory Visit: Payer: Commercial Managed Care - PPO | Admitting: Family Medicine

## 2019-03-16 ENCOUNTER — Other Ambulatory Visit: Payer: Self-pay

## 2019-03-16 VITALS — BP 150/86 | HR 81 | Temp 98.4°F | Wt 202.0 lb

## 2019-03-16 DIAGNOSIS — R591 Generalized enlarged lymph nodes: Secondary | ICD-10-CM | POA: Diagnosis not present

## 2019-03-16 DIAGNOSIS — Z23 Encounter for immunization: Secondary | ICD-10-CM

## 2019-03-16 NOTE — Progress Notes (Signed)
   Subjective:    Patient ID: Daniel Stevens, male    DOB: 10/07/1960, 58 y.o.   MRN: [id number]  HPI He is here for evaluation of a lump in the left submandibular area.  He has also noted the slight sore throat but no earache, cough or congestion.  He did have some dental work done approximately 3 weeks ago.   Review of Systems     Objective:   Physical Exam Alert and in no distress.  A 2 cm round, smooth, movable and slightly tender lesion is noted in the left mid mandibular area.  No other adenopathy noted.  Oral mucosa is normal.  No palpable lesions noted of the tongue or in the gum area.  Pharyngeal area is negative.       Assessment & Plan:  Lymphadenopathy of head and neck  Need for influenza vaccination - Plan: Flu Vaccine QUAD 6+ mos PF IM (Fluarix Quad PF) I explained that I did not find any reason to treat.  No evidence of infection.  Explained that if this gets worse, he should return here for further evaluation.  He was comfortable with that.

## 2019-03-19 ENCOUNTER — Other Ambulatory Visit (HOSPITAL_COMMUNITY)
Admission: RE | Admit: 2019-03-19 | Discharge: 2019-03-19 | Disposition: A | Discharge status: Home / Self Care | Payer: Commercial Managed Care - PPO | Source: Ambulatory Visit | Attending: Neurology | Admitting: Neurology

## 2019-03-19 ENCOUNTER — Telehealth: Payer: Self-pay | Admitting: Family Medicine

## 2019-03-19 DIAGNOSIS — Z01812 Encounter for preprocedural laboratory examination: Secondary | ICD-10-CM | POA: Insufficient documentation

## 2019-03-19 DIAGNOSIS — Z20828 Contact with and (suspected) exposure to other viral communicable diseases: Secondary | ICD-10-CM | POA: Insufficient documentation

## 2019-03-19 NOTE — Telephone Encounter (Signed)
Forwarding since you saw him Friday.

## 2019-03-19 NOTE — Telephone Encounter (Signed)
Schedule him to come in tomorrow

## 2019-03-19 NOTE — Telephone Encounter (Signed)
Pt called and lump in throat is really large and hurts when he eats.  He would like an antibiotic sent to [facility] on [address].  He saw you on Friday.  [phone] pt ph

## 2019-03-19 NOTE — Telephone Encounter (Signed)
Pt declines appt pt stated that it has went away but will keep [place] informed. [place]

## 2019-03-20 LAB — NOVEL CORONAVIRUS, NAA (HOSP ORDER, SEND-OUT TO REF LAB; TAT 18-24 HRS): SARS-CoV-2, NAA: NOT DETECTED

## 2019-03-22 ENCOUNTER — Other Ambulatory Visit: Payer: Self-pay

## 2019-03-22 ENCOUNTER — Ambulatory Visit (INDEPENDENT_AMBULATORY_CARE_PROVIDER_SITE_OTHER): Payer: Commercial Managed Care - PPO | Admitting: Neurology

## 2019-03-22 DIAGNOSIS — G4720 Circadian rhythm sleep disorder, unspecified type: Secondary | ICD-10-CM

## 2019-03-22 DIAGNOSIS — G4719 Other hypersomnia: Secondary | ICD-10-CM

## 2019-03-22 DIAGNOSIS — G4733 Obstructive sleep apnea (adult) (pediatric): Secondary | ICD-10-CM | POA: Diagnosis not present

## 2019-03-22 DIAGNOSIS — R351 Nocturia: Secondary | ICD-10-CM

## 2019-03-22 DIAGNOSIS — E669 Obesity, unspecified: Secondary | ICD-10-CM

## 2019-03-22 DIAGNOSIS — Z820 Family history of epilepsy and other diseases of the nervous system: Secondary | ICD-10-CM

## 2019-03-27 ENCOUNTER — Telehealth: Payer: Self-pay

## 2019-03-27 DIAGNOSIS — G4733 Obstructive sleep apnea (adult) (pediatric): Secondary | ICD-10-CM

## 2019-03-27 NOTE — Progress Notes (Signed)
Patient referred by Chana Bode, PA, seen by me on 01/03/19, diagnostic PSG on 01/28/19. Patient had a CPAP titration study on 03/22/19.  Please call and inform patient that I have entered an order for treatment with positive airway pressure (PAP) treatment for obstructive sleep apnea (OSA). He did well during the latest sleep study with CPAP. We will, therefore, arrange for a machine for home use through a DME (durable medical equipment) company of His choice; and I will see the patient back in follow-up in about 10 weeks. Please also explain to the patient that I will be looking out for compliance data, which can be downloaded from the machine (stored on an SD card, that is inserted in the machine) or via remote access through a modem, that is built into the machine. At the time of the followup appointment we will discuss sleep study results and how it is going with PAP treatment at home. Please advise patient to bring His machine at the time of the first FU visit, even though this is cumbersome. Bringing the machine for every visit after that will likely not be needed, but often helps for the first visit to troubleshoot if needed. Please re-enforce the importance of compliance with treatment and the need for [place] to monitor compliance data - often an insurance requirement and actually good feedback for the patient as far as how they are doing.  Also remind patient, that any interim PAP machine or mask issues should be first addressed with the DME company, as they can often help better with technical and mask fit issues. Please ask if patient has a preference regarding DME company.  Please also make sure, the patient has a follow-up appointment with me in about 10 weeks from the setup date, thanks. May see one of our nurse practitioners if needed for proper timing of the FU appointment.  Please fax or rout report to the referring provider. Thanks,   Star Age, MD, PhD [facility])

## 2019-03-27 NOTE — Procedures (Signed)
PATIENT'S NAME:  Daniel Stevens, Daniel Stevens DOB:      1960-08-16      MR#:    [id number]     DATE OF RECORDING: 03/22/2019 REFERRING M.D.:  Chana Bode, PA Study Performed:   CPAP  Titration HISTORY: 58 year old man with a history of diabetes, hyperlipidemia, gout, prior smoking, and overweight state, who presents for a full night CPAP titration study. His diagnostic Polysomnogram on 01/28/2019 revealed a total AHI of 14.4/hour, REM AHI of 11.2/hour, supine AHI of 24.8/hour and O2 nadir of 80%.  The patient endorsed the Epworth Sleepiness Scale at 15 points. The patient's weight 205 pounds with a height of 69 (inches), resulting in a BMI of 30.4 kg/m2. The patient's neck circumference measured 16.25 inches.  CURRENT MEDICATIONS: Norco, Advil, Metformin, Naproxen  PROCEDURE:  This is a multichannel digital polysomnogram utilizing the SomnoStar 11.2 system.  Electrodes and sensors were applied and monitored per AASM Specifications.   EEG, EOG, Chin and Limb EMG, were sampled at 200 Hz.  ECG, Snore and Nasal Pressure, Thermal Airflow, Respiratory Effort, CPAP Flow and Pressure, Oximetry was sampled at 50 Hz. Digital video and audio were recorded.      The patient was fitted with a medium Simplus FFM. CPAP was initiated at 5 cm H20 with heated humidity per AASM standards and pressure was advanced to 8 cm H20 because of hypopneas, apneas and desaturations.  At a PAP pressure of 7 cmH20, there was a reduction of the AHI to 0.8/hour with supine REM sleep achieved and O2 nadir of 92%.   Lights Out was at 22:54 and Lights On at 03:04. Total recording time (TRT) was 251 minutes, with a total sleep time (TST) of 230.5 minutes. The patient's sleep latency was 9.5 minutes. REM latency was delayed at 140 minutes.  The sleep efficiency was 91.8 %.    SLEEP ARCHITECTURE: WASO (Wake after sleep onset) was 19 minutes with mild sleep fragmentation noted. There were 19.5 minutes in Stage N1, 148 minutes Stage N2, 1 minutes Stage  N3 and 62 minutes in Stage REM.  The percentage of Stage N1 was 8.5%, Stage N2 was 64.2%, which is increased, Stage N3 was .4% and Stage R (REM sleep) was 26.9%, which is mildly increased, and in keeping with rebound. The arousals were noted as: 69 were spontaneous, 0 were associated with PLMs, 3 were associated with respiratory events.  RESPIRATORY ANALYSIS:  There was a total of 4 respiratory events: 0 obstructive apneas, 0 central apneas and 1 mixed apneas with a total of 1 apneas and an apnea index (AI) of .3 /hour. There were 3 hypopneas with a hypopnea index of .8/hour. The patient also had 0 respiratory event related arousals (RERAs).      The total APNEA/HYPOPNEA INDEX  (AHI) was 1. /hour and the total RESPIRATORY DISTURBANCE INDEX was 1. /hour  2 events occurred in REM sleep and 2 events in NREM. The REM AHI was 1.9 /hour versus a non-REM AHI of .7 /hour.  The patient spent 33.5 minutes of total sleep time in the supine position and 197 minutes in non-supine. The supine AHI was 1.8, versus a non-supine AHI of 0.9.  OXYGEN SATURATION & C02:  The baseline 02 saturation was 0%, with the lowest being 90%. Time spent below 89% saturation equaled 0 minutes.  PERIODIC LIMB MOVEMENTS:  The patient had a total of 6 Periodic Limb Movements. The Periodic Limb Movement (PLM) index was 1.6 and the PLM Arousal index was 0 /hour.  Audio and video analysis did not show any abnormal or unusual movements, behaviors, phonations or vocalizations. The patient took no bathroom breaks. The EKG was in keeping with normal sinus rhythm.  Post-study, the patient indicated that sleep was better than usual.   IMPRESSION: 1. Obstructive Sleep Apnea OSA) 2. Dysfunctions associated with sleep stages or arousal from sleep   RECOMMENDATIONS: 1. This study demonstrates resolution of the patient's obstructive sleep apnea with CPAP therapy. I will, therefore, start the patient on home CPAP treatment at a pressure of 7 cm via  medium Simplus FFM with heated humidity. The patient should be reminded to be fully compliant with PAP therapy to improve sleep related symptoms and decrease long term cardiovascular risks. The patient should be reminded, that it may take up to 3 months to get fully used to using PAP with all planned sleep. The earlier full compliance is achieved, the better long term compliance tends to be. Please note that untreated obstructive sleep apnea may carry additional perioperative morbidity. Patients with significant obstructive sleep apnea should receive perioperative PAP therapy and the surgeons and particularly the anesthesiologist should be informed of the diagnosis and the severity of the sleep disordered breathing. 2. This study shows sleep fragmentation and abnormal sleep stage percentages; these are nonspecific findings and per se do not signify an intrinsic sleep disorder or a cause for the patient's sleep-related symptoms. Causes include (but are not limited to) the first night effect of the sleep study, circadian rhythm disturbances, medication effect or an underlying mood disorder or medical problem.  3. The patient should be cautioned not to drive, work at heights, or operate dangerous or heavy equipment when tired or sleepy. Review and reiteration of good sleep hygiene measures should be pursued with any patient. 4. The patient will be seen in follow-up in the sleep clinic at [facility] for discussion of the test results, symptom and treatment compliance review, further management strategies, etc. The referring provider will be notified of the test results.   I certify that I have reviewed the entire raw data recording prior to the issuance of this report in accordance with the Standards of Accreditation of the American Academy of Sleep Medicine (AASM)     Star Age, MD, PhD Diplomat, American Board of Neurology and Sleep Medicine (Neurology and Sleep Medicine)

## 2019-03-27 NOTE — Telephone Encounter (Signed)
I called pt. I advised pt that Dr. Rexene Alberts reviewed their sleep study results and found that pt did well during the latest sleep study with the cpap. Dr. Rexene Alberts recommends that pt start a cpap at home. I reviewed PAP compliance expectations with the pt. Pt is agreeable to starting a CPAP. I advised pt that an order will be sent to a DME, [facility], and [facility] will call the pt within about one week after they file with the pt's insurance. [facility] will show the pt how to use the machine, fit for masks, and troubleshoot the CPAP if needed. A follow up appt was made for insurance purposes with Amy, NP on 06/26/19 at 3:30pm. Pt verbalized understanding to arrive 15 minutes early and bring their CPAP. A letter with all of this information in it will be mailed to the pt as a reminder. I verified with the pt that the address we have on file is correct. Pt verbalized understanding of results. Pt had no questions at this time but was encouraged to call back if questions arise. I have sent the order to [facility] and have received confirmation that they have received the order.

## 2019-03-27 NOTE — Addendum Note (Signed)
Addended by: Star Age on: 03/27/2019 09:35 AM   Modules accepted: Orders

## 2019-03-27 NOTE — Telephone Encounter (Signed)
-----   Message from Star Age, MD sent at 03/27/2019  9:34 AM EDT ----- Patient referred by Chana Bode, PA, seen by me on 01/03/19, diagnostic PSG on 01/28/19. Patient had a CPAP titration study on 03/22/19.  Please call and inform patient that I have entered an order for treatment with positive airway pressure (PAP) treatment for obstructive sleep apnea (OSA). He did well during the latest sleep study with CPAP. We will, therefore, arrange for a machine for home use through a DME (durable medical equipment) company of His choice; and I will see the patient back in follow-up in about 10 weeks. Please also explain to the patient that I will be looking out for compliance data, which can be downloaded from the machine (stored on an SD card, that is inserted in the machine) or via remote access through a modem, that is built into the machine. At the time of the followup appointment we will discuss sleep study results and how it is going with PAP treatment at home. Please advise patient to bring His machine at the time of the first FU visit, even though this is cumbersome. Bringing the machine for every visit after that will likely not be needed, but often helps for the first visit to troubleshoot if needed. Please re-enforce the importance of compliance with treatment and the need for [place] to monitor compliance data - often an insurance requirement and actually good feedback for the patient as far as how they are doing.  Also remind patient, that any interim PAP machine or mask issues should be first addressed with the DME company, as they can often help better with technical and mask fit issues. Please ask if patient has a preference regarding DME company.  Please also make sure, the patient has a follow-up appointment with me in about 10 weeks from the setup date, thanks. May see one of our nurse practitioners if needed for proper timing of the FU appointment.  Please fax or rout report to the referring  provider. Thanks,   Star Age, MD, PhD [facility])

## 2019-03-29 NOTE — Addendum Note (Signed)
Addended by: Star Age on: 03/29/2019 05:11 PM   Modules accepted: Orders

## 2019-03-29 NOTE — Telephone Encounter (Signed)
cpap order placed

## 2019-04-18 ENCOUNTER — Other Ambulatory Visit: Payer: Self-pay

## 2019-04-18 ENCOUNTER — Encounter: Payer: Self-pay | Admitting: Medical

## 2019-04-18 ENCOUNTER — Ambulatory Visit: Payer: Commercial Managed Care - PPO | Admitting: Medical

## 2019-04-18 VITALS — Temp 98.7°F | Ht 69.5 in | Wt 204.0 lb

## 2019-04-18 DIAGNOSIS — M62830 Muscle spasm of back: Secondary | ICD-10-CM | POA: Diagnosis not present

## 2019-04-18 DIAGNOSIS — S39012A Strain of muscle, fascia and tendon of lower back, initial encounter: Secondary | ICD-10-CM | POA: Diagnosis not present

## 2019-04-18 DIAGNOSIS — S161XXA Strain of muscle, fascia and tendon at neck level, initial encounter: Secondary | ICD-10-CM | POA: Diagnosis not present

## 2019-04-18 MED ORDER — HYDROCODONE-ACETAMINOPHEN 5-325 MG PO TABS: 1.0000 | ORAL_TABLET | Freq: Four times a day (QID) | ORAL | 0 refills | Status: DC | PRN

## 2019-04-18 MED ORDER — NAPROXEN 500 MG PO TABS: 500.0000 mg | ORAL_TABLET | Freq: Two times a day (BID) | ORAL | 0 refills | Status: DC

## 2019-04-18 MED ORDER — METHOCARBAMOL 500 MG PO TABS: 500.0000 mg | ORAL_TABLET | Freq: Four times a day (QID) | ORAL | 0 refills | Status: DC

## 2019-04-18 NOTE — Progress Notes (Signed)
Pt states he will call back.

## 2019-04-18 NOTE — Progress Notes (Signed)
Subjective:     Patient ID: Daniel Stevens, male   DOB: Jul 05, 1960, 58 y.o.   MRN: [id number]  This visit type was conducted due to national recommendations for restrictions regarding the COVID-19 Pandemic (e.g. social distancing) in an effort to limit this patient's exposure and mitigate transmission in our community.  Due to their co-morbid illnesses, this patient is at least at moderate risk for complications without adequate follow up.  This format is felt to be most appropriate for this patient at this time.    Documentation for virtual audio and video telecommunications through Zoom encounter:  The patient was located at home. The provider was located in the office. The patient did consent to this visit and is aware of possible charges through their insurance for this visit.  The other persons participating in this telemedicine service were none. Time spent on call was 20 minutes and in review of previous records 20 minutes total.  This virtual service is not related to other E/M service within previous 7 days.   HPI Chief Complaint  Patient presents with  . Neck Pain    radiating down right shoulder-sore to the touch    Virtual consult today for neck and back pain.  Denies injury or trauma, no fall.  Started about 5 days ago with pain in the right neck right upper back pain down to the right shoulder and the back of the shoulder.  He drives a fork lift at work and it hurts to turn around looking over his shoulder.  He wonders if he laid wrong in the bed to cause this pain in the neck and back.  He denies any numbness, tingling, weakness.  No swelling.  No rash.  It hurts to move the neck, hurts to move shoulder and upper back.  He does lift objects at work.  He has been trying over-the-counter ibuprofen 800 mg a couple times a day without improvement.  He is right-handed.  Heat does seem to help.  No other aggravating or relieving factors. No other complaint.  Past Medical History:   Diagnosis Date  . Diabetes mellitus without complication ([place]) 04/2481  . Former smoker    quit 2016. smoked on and off for years, would quit and start back.  5 pack year hx/o as of 02/2016  . Hyperlipidemia 02/2016  . Obesity    Current Outpatient Medications on File Prior to Visit  Medication Sig Dispense Refill  . blood glucose meter kit and supplies KIT Check daily or when not feeling well 1 each 0  . ibuprofen (ADVIL,MOTRIN) 200 MG tablet Take 2 tablets (400 mg total) by mouth 3 (three) times daily. 24 tablet 0  . metFORMIN (GLUCOPHAGE) 500 MG tablet Take 1 tablet (500 mg total) by mouth 2 (two) times daily with a meal. 1 tablet po BID for diabetes (Patient not taking: Reported on 03/16/2019) 60 tablet 0  . naltrexone (DEPADE) 50 MG tablet Take 0.5 tablets (25 mg total) by mouth daily. (Patient not taking: Reported on 04/18/2019) 30 tablet 0   No current facility-administered medications on file prior to visit.     Review of Systems As in subjective    Objective:   Physical Exam Due to coronavirus pandemic stay at home measures, patient visit was virtual and they were not examined in person.   Temp 98.7 F (37.1 C)   Ht 5' 9.5" (1.765 m)   Wt 204 lb (92.5 kg)   BMI 29.69 kg/m   General: Well-developed well-nourished  no acute distress Neck range of motion is full but does seem to cause some pain with rotation to the right and extension He seems tender with shoulder shrug, seems to have pain in his right upper back with right arm rotation and range of motion.  With arm range of motion he seemed to be more bothered by upper back pain than the arm or shoulder Exam limited by virtual encounter  CT cervical spine 03/30/2018 IMPRESSION: 1. Loss of cervical lordosis.  Mid cervical spondylosis. 2. No evidence for acute cervical spine fracture.      Assessment:     Encounter Diagnoses  Name Primary?  . Neck strain, initial encounter Yes  . Back strain, initial encounter    . Back spasm        Plan:     Interestingly I saw him about a year ago for similar.  I reviewed his CT cervical spine from October 2019 that showed minimal cervical spondylosis.  Begin Robaxin at bedtime the next few days, begin Naprosyn twice daily with food for pain and inflammation, and for breakthrough pain can use Norco hydrocodone sparingly.  Discussed risk and benefits of medication, proper use of medications.   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  If not much improved within the next week, consider physical therapy referral  Mann was seen today for neck pain.  Diagnoses and all orders for this visit:  Neck strain, initial encounter  Back strain, initial encounter  Back spasm  Other orders -     Discontinue: HYDROcodone-acetaminophen (NORCO) 5-325 MG tablet; Take 1 tablet by mouth every 6 (six) hours as needed for moderate pain. -     Discontinue: methocarbamol (ROBAXIN) 500 MG tablet; Take 1 tablet (500 mg total) by mouth 4 (four) times daily. -     Discontinue: naproxen (NAPROSYN) 500 MG tablet; Take 1 tablet (500 mg total) by mouth 2 (two) times daily with a meal. -     methocarbamol (ROBAXIN) 500 MG tablet; Take 1 tablet (500 mg total) by mouth 4 (four) times daily. -     naproxen (NAPROSYN) 500 MG tablet; Take 1 tablet (500 mg total) by mouth 2 (two) times daily with a meal. -     HYDROcodone-acetaminophen (NORCO) 5-325 MG tablet; Take 1 tablet by mouth every 6 (six) hours as needed for moderate pain.

## 2019-04-24 ENCOUNTER — Telehealth: Payer: Self-pay

## 2019-04-24 DIAGNOSIS — S39012A Strain of muscle, fascia and tendon of lower back, initial encounter: Secondary | ICD-10-CM

## 2019-04-24 DIAGNOSIS — S161XXA Strain of muscle, fascia and tendon at neck level, initial encounter: Secondary | ICD-10-CM

## 2019-04-24 NOTE — Telephone Encounter (Signed)
Pt. Called stating that he saw you about 1 week ago and his shoulder is still bothering him a lot, has a stinging pain in it that comes and goes when working, has been taking muscle relaxer you called in for him only has 2 left, they help some but still in a lot of pain from the shoulder going across the collar bone to the neck. Please advise.

## 2019-04-24 NOTE — Telephone Encounter (Signed)
See if we can get him into Physical Therapy ASAP

## 2019-04-25 NOTE — Telephone Encounter (Signed)
Pt was notified as well

## 2019-04-25 NOTE — Telephone Encounter (Signed)
Received this notice from [facility]: "Janett Billow called the patient to schedule on 11/04, no answer LVM, I called on 11/09 and went over financials and used bundle options with the patient and he asked to talk things over with his wife and he would call us back, I called again on 11/23 to follow up with him and he requested I send him an email of his financials and used bundle options, so I sent him that in an email, I emailed the patient to follow up on 11/24, and called again today 11/25. No answer LVM. I am voiding this order, just wanted to make you aware. "  I called pt. He has lots of questions regarding the cost of the cpap. He does plan on starting it. I advised him that I will ask [facility] to reach out to him again. Pt verbalized understanding.

## 2019-04-25 NOTE — Telephone Encounter (Signed)
Faxed over order to [facility]

## 2019-05-10 ENCOUNTER — Telehealth: Payer: Self-pay | Admitting: Medical

## 2019-05-10 DIAGNOSIS — S161XXA Strain of muscle, fascia and tendon at neck level, initial encounter: Secondary | ICD-10-CM

## 2019-05-10 DIAGNOSIS — M25511 Pain in right shoulder: Secondary | ICD-10-CM

## 2019-05-10 NOTE — Telephone Encounter (Signed)
Pt called and stated that since he has been doing physical therapy his shoulder has been hurting more and the Naproxen has not been helping. He wasn't to see if you could prescribe something stronger or a muscle relaxer

## 2019-05-11 NOTE — Telephone Encounter (Signed)
done

## 2019-05-11 NOTE — Telephone Encounter (Signed)
It is time to refer to orthopedics.  Please make referral

## 2019-05-15 ENCOUNTER — Telehealth: Payer: Self-pay

## 2019-05-15 NOTE — Telephone Encounter (Signed)
Patient has called and requested a muscle relaxer for his pain. Please advise.

## 2019-05-16 ENCOUNTER — Other Ambulatory Visit: Payer: Self-pay | Admitting: Medical

## 2019-05-16 MED ORDER — METHOCARBAMOL 500 MG PO TABS: 500.0000 mg | ORAL_TABLET | Freq: Four times a day (QID) | ORAL | 0 refills | Status: DC

## 2019-05-16 MED ORDER — NAPROXEN 500 MG PO TABS: 500.0000 mg | ORAL_TABLET | Freq: Two times a day (BID) | ORAL | 0 refills | Status: DC

## 2019-05-16 NOTE — Telephone Encounter (Signed)
Med sent.

## 2019-06-06 ENCOUNTER — Ambulatory Visit: Payer: Commercial Managed Care - PPO | Admitting: Orthopaedic Surgery

## 2019-06-26 ENCOUNTER — Ambulatory Visit: Payer: Commercial Managed Care - PPO | Admitting: Family Medicine

## 2019-07-05 ENCOUNTER — Ambulatory Visit: Payer: Commercial Managed Care - PPO | Admitting: Medical

## 2019-07-05 ENCOUNTER — Other Ambulatory Visit: Payer: Self-pay

## 2019-07-05 ENCOUNTER — Encounter: Payer: Self-pay | Admitting: Medical

## 2019-07-05 VITALS — Ht 69.5 in | Wt 202.0 lb

## 2019-07-05 DIAGNOSIS — R519 Headache, unspecified: Secondary | ICD-10-CM

## 2019-07-05 DIAGNOSIS — H9202 Otalgia, left ear: Secondary | ICD-10-CM | POA: Diagnosis not present

## 2019-07-05 DIAGNOSIS — H68012 Acute Eustachian salpingitis, left ear: Secondary | ICD-10-CM

## 2019-07-05 NOTE — Progress Notes (Signed)
Subjective:     Patient ID: Daniel Stevens, male   DOB: 1960-10-20, 59 y.o.   MRN: [id number]  This visit type was conducted due to national recommendations for restrictions regarding the COVID-19 Pandemic (e.g. social distancing) in an effort to limit this patient's exposure and mitigate transmission in our community.  Due to their co-morbid illnesses, this patient is at least at moderate risk for complications without adequate follow up.  This format is felt to be most appropriate for this patient at this time.    Documentation for virtual audio and video telecommunications through Zoom encounter:  The patient was located at home. The provider was located in the office. The patient did consent to this visit and is aware of possible charges through their insurance for this visit.  The other persons participating in this telemedicine service were none. Time spent on call was 20 minutes and in review of previous records 20 minutes total.  This virtual service is not related to other E/M service within previous 7 days.   HPI Chief Complaint  Patient presents with  . Ear Pain    left  . Headache   Virtual consult today for ear pain.  He notes starting with left ear pain and headache this morning.   Has had throbbing in ears, just left ear x 1 days.  No ear drainage.   Tight feeling, but no popping in left ear.  Feels stopped.    No sore throat, no fever, no cough.  Has slight runny nose.   Forehead headache.  No body aches.  No nausea vomiting diarrhea.   Use some ibuprofen.  He had 1 day of loose stool last week, had a Covid test due to those symptoms but that came back negative.  He thinks he just ate some bad cheese.  No Covid contact.  No sick contact.  No other aggravating or relieving factors. No other complaint.  Review of Systems As in subjective    Objective:   Physical Exam Due to coronavirus pandemic stay at home measures, patient visit was virtual and they were not  examined in person.   Ht 5' 9.5" (1.765 m)   Wt 202 lb (91.6 kg)   BMI 29.40 kg/m       Assessment:     , Encounter Diagnoses  Name Primary?  . Otalgia of left ear Yes  . Nonintractable headache, unspecified chronicity pattern, unspecified headache type   . Eustachian salpingitis, acute, left        Plan:     Discussed his symptoms and concerns.  Discussed the limitations of virtual consult.  His symptoms have been present for 1 day or less.  We discussed possible causes of symptoms but I suspect eustachian tube dysfunction.  Advised Mucinex D for the next 5 days over-the-counter, over-the-counter ibuprofen 3 tablets twice a day for the next 5 days,, increase water intake, consider nasal allergy spray such as Flonase over-the-counter.  If not much improved within a week then call back or return.  If much worse symptoms in the next few days or different symptoms call back.  If any new symptoms suggestive of Covid such as fever, body aches, nausea or vomiting, severe headache, loss of sense of smell or taste, or other variety of symptoms as discussed in call back and plan to get Covid test.  Of note he reports that he had a Covid test last week due to 1 day of loose stool but that came back negative.  Daniel Stevens was seen today for ear pain and headache.  Diagnoses and all orders for this visit:  Otalgia of left ear  Nonintractable headache, unspecified chronicity pattern, unspecified headache type  Eustachian salpingitis, acute, left

## 2019-09-14 ENCOUNTER — Ambulatory Visit: Payer: Commercial Managed Care - PPO | Admitting: Medical

## 2019-09-14 ENCOUNTER — Ambulatory Visit: Payer: Commercial Managed Care - PPO | Admitting: Family Medicine

## 2019-09-14 ENCOUNTER — Encounter (HOSPITAL_COMMUNITY): Payer: Self-pay | Admitting: Emergency Medicine

## 2019-09-14 ENCOUNTER — Emergency Department (HOSPITAL_COMMUNITY)
Admission: EM | Admit: 2019-09-14 | Discharge: 2019-09-14 | Disposition: A | Discharge status: Home / Self Care | Payer: Commercial Managed Care - PPO | Attending: Emergency Medicine | Admitting: Emergency Medicine

## 2019-09-14 ENCOUNTER — Encounter: Payer: Self-pay | Admitting: Medical

## 2019-09-14 ENCOUNTER — Encounter: Payer: Self-pay | Admitting: Family Medicine

## 2019-09-14 ENCOUNTER — Other Ambulatory Visit: Payer: Self-pay

## 2019-09-14 VITALS — BP 148/96 | HR 103 | Temp 97.8°F | Wt 204.6 lb

## 2019-09-14 DIAGNOSIS — R5383 Other fatigue: Secondary | ICD-10-CM | POA: Insufficient documentation

## 2019-09-14 DIAGNOSIS — M542 Cervicalgia: Secondary | ICD-10-CM

## 2019-09-14 DIAGNOSIS — G4733 Obstructive sleep apnea (adult) (pediatric): Secondary | ICD-10-CM

## 2019-09-14 DIAGNOSIS — R42 Dizziness and giddiness: Secondary | ICD-10-CM | POA: Diagnosis present

## 2019-09-14 LAB — CBG MONITORING, ED: Glucose-Capillary: 163 mg/dL — ABNORMAL HIGH (ref 70–99)

## 2019-09-14 NOTE — Patient Instructions (Signed)
Take 2 Tylenol 4 times per day and also 2 Aleve twice per day .  Take this regularly Call next week to let us know how you are doing. You can take an over-the-counter sleep medicine for right now also

## 2019-09-14 NOTE — ED Notes (Signed)
After triaging patient, patient states he has appt with PCP at 1045 and is going to leave.

## 2019-09-14 NOTE — ED Triage Notes (Signed)
Patient c/o feeling fatigued, dizziness when bending over and right shoulder tingling x a month. States he has been working a lot of overtime and not had any time to rest or sleep.
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
## 2019-09-19 ENCOUNTER — Encounter: Payer: Commercial Managed Care - PPO | Admitting: Medical

## 2019-11-06 ENCOUNTER — Telehealth: Payer: Self-pay

## 2019-11-06 DIAGNOSIS — Z1211 Encounter for screening for malignant neoplasm of colon: Secondary | ICD-10-CM

## 2019-11-06 NOTE — Telephone Encounter (Signed)
See Daniel Stevens's message about referral for colonoscopy.  Double check to see if he has seen GI in the past to refer back to them

## 2019-11-06 NOTE — Telephone Encounter (Signed)
I called the pt. To get him back on the schedule which I got him scheduled for his CPE on 12/04/19. He stated that he also needs to get scheduled for his colonoscopy his wife keeps bothering him about that. If you guys could do a referral for him for the colonoscopy.

## 2019-11-08 ENCOUNTER — Encounter: Payer: Self-pay | Admitting: Internal Medicine

## 2019-11-08 NOTE — Telephone Encounter (Signed)
Patient stated he has never had a colonoscopy done. Will place order for colonoscopy.

## 2019-12-04 ENCOUNTER — Ambulatory Visit: Payer: Commercial Managed Care - PPO | Admitting: Medical

## 2019-12-04 ENCOUNTER — Encounter: Payer: Self-pay | Admitting: Medical

## 2019-12-04 ENCOUNTER — Other Ambulatory Visit: Payer: Self-pay

## 2019-12-04 VITALS — BP 140/86 | HR 73 | Ht 70.0 in | Wt 202.2 lb

## 2019-12-04 DIAGNOSIS — Z0000 Encounter for general adult medical examination without abnormal findings: Secondary | ICD-10-CM

## 2019-12-04 DIAGNOSIS — D171 Benign lipomatous neoplasm of skin and subcutaneous tissue of trunk: Secondary | ICD-10-CM

## 2019-12-04 DIAGNOSIS — M545 Low back pain, unspecified: Secondary | ICD-10-CM

## 2019-12-04 DIAGNOSIS — M25512 Pain in left shoulder: Secondary | ICD-10-CM

## 2019-12-04 DIAGNOSIS — Z23 Encounter for immunization: Secondary | ICD-10-CM | POA: Insufficient documentation

## 2019-12-04 DIAGNOSIS — M25511 Pain in right shoulder: Secondary | ICD-10-CM

## 2019-12-04 DIAGNOSIS — E669 Obesity, unspecified: Secondary | ICD-10-CM

## 2019-12-04 DIAGNOSIS — G8929 Other chronic pain: Secondary | ICD-10-CM

## 2019-12-04 DIAGNOSIS — Z1211 Encounter for screening for malignant neoplasm of colon: Secondary | ICD-10-CM

## 2019-12-04 DIAGNOSIS — Z7189 Other specified counseling: Secondary | ICD-10-CM | POA: Diagnosis not present

## 2019-12-04 DIAGNOSIS — Z125 Encounter for screening for malignant neoplasm of prostate: Secondary | ICD-10-CM | POA: Diagnosis not present

## 2019-12-04 DIAGNOSIS — M1A9XX0 Chronic gout, unspecified, without tophus (tophi): Secondary | ICD-10-CM

## 2019-12-04 DIAGNOSIS — Z6829 Body mass index (BMI) 29.0-29.9, adult: Secondary | ICD-10-CM

## 2019-12-04 DIAGNOSIS — Z87891 Personal history of nicotine dependence: Secondary | ICD-10-CM

## 2019-12-04 DIAGNOSIS — Z7185 Encounter for immunization safety counseling: Secondary | ICD-10-CM | POA: Insufficient documentation

## 2019-12-04 DIAGNOSIS — E785 Hyperlipidemia, unspecified: Secondary | ICD-10-CM

## 2019-12-04 DIAGNOSIS — E118 Type 2 diabetes mellitus with unspecified complications: Secondary | ICD-10-CM

## 2019-12-04 NOTE — Patient Instructions (Signed)
It was a pleasure seeing you today  Recommendations See your eye doctor yearly See your dentist yearly See me yearly for physical   Cancer screens: Plan for colonoscopy next month as scheduled  We did a prostate blood test and rectal exam today  Monitor your skin for new moles or changing lesions   Vaccine counseling:  I recommend a yearly flu shot  I recommend a tetanus shot every 10 years and you are due now  I recommend the shingles 2 shot vaccine series.  Check your insurance to make sure this is covered  I recommend a hepatitis B vaccine if you are not immune  Please get [place] a copy of the Covid vaccine card   Diabetes  Complications of diabetes can include vision changes and blurred vision, poor wound healing, frequent infections, amputations, heart disease, kidney damage, nerve damage, motility issues in the intestines, among other complications.    The goal of diabetes care is to reduce or prevent complications.  I recommend you continue to help take care of your diabetes through standards of care as below  Diabetics should routinely come in for follow-up every 4-6 months, which includes discussion of diabetes care, review of medications, appropriate lab work, appropriate screenings, and to help prevent complications  Routine lab testing for diabetes care includes the 28-month sugar marker called the hemoglobin A1c.  The goal of this lab is to be less than 7%.  This corresponds to average blood sugars less than 130.  Other common labs include cholesterol panel, liver kidney and electrolyte blood tests, yearly microalbumin kidney marker, as well as other appropriate tests.  Exercise regularly, most days per week if possible  Check your blood sugars fasting daily.   The goal is to keep your morning fasting sugar less than 130.  In general you should try to keep your blood sugars between 80 - 130 fasting or before meals.  We also want to avoid hypoglycemia or low blood  sugars less than 70.  Make sure you understand how to treat hypoglycemic episodes.  Ask me about this if we had not discussed this.  Check your feet daily for sores or wounds that are not healing.  Poor healing can lead to infections and amputations.  See your eye doctor yearly for routine eye exam and diabetic eye exam.  Make sure your eye doctor sends [place] a copy of the report  We recommend vaccine/immunizations be up-to-date.  Diabetic should have tetanus vaccine every 10 years, pneumococcal vaccine, yearly influenza vaccine, Covid vaccine, hepatitis B vaccine if not immune, and shingles vaccine if over age 70.   Shoulder pains: This may be tendinitis.  Your exam is not that significant today.  If you have shoulder pains ongoing you can go get x-rays as a baseline.  Stretch regularly.  You can use short-term ibuprofen and Tylenol for pain  Please go to [facility] for your shoulder xrays.   Their hours are 8am - 4:30 pm Monday - Friday.  Take your insurance card with you.  [facility] [phone]  [facility] [address], [place], [place] [address]  [address] [place], [place] [address]
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
## 2019-12-05 ENCOUNTER — Other Ambulatory Visit: Payer: Self-pay | Admitting: Medical

## 2019-12-05 LAB — CBC
Hematocrit: 41.1 % (ref 37.5–51.0)
Hemoglobin: 13.5 g/dL (ref 13.0–17.7)
MCH: 28.8 pg (ref 26.6–33.0)
MCHC: 32.8 g/dL (ref 31.5–35.7)
MCV: 88 fL (ref 79–97)
Platelets: 208 10*3/uL (ref 150–450)
RBC: 4.69 x10E6/uL (ref 4.14–5.80)
RDW: 13.9 % (ref 11.6–15.4)
WBC: 6.2 10*3/uL (ref 3.4–10.8)

## 2019-12-05 LAB — COMPREHENSIVE METABOLIC PANEL
ALT: 15 IU/L (ref 0–44)
AST: 13 IU/L (ref 0–40)
Albumin/Globulin Ratio: 1.8 (ref 1.2–2.2)
Albumin: 4.2 g/dL (ref 3.8–4.9)
Alkaline Phosphatase: 51 IU/L (ref 48–121)
BUN/Creatinine Ratio: 18 (ref 10–24)
BUN: 16 mg/dL (ref 8–27)
Bilirubin Total: 0.3 mg/dL (ref 0.0–1.2)
CO2: 23 mmol/L (ref 20–29)
Calcium: 9.2 mg/dL (ref 8.6–10.2)
Chloride: 106 mmol/L (ref 96–106)
Creatinine, Ser: 0.91 mg/dL (ref 0.76–1.27)
GFR calc Af Amer: 106 mL/min/{1.73_m2} (ref >59)
GFR calc non Af Amer: 91 mL/min/{1.73_m2} (ref >59)
Globulin, Total: 2.3 g/dL (ref 1.5–4.5)
Glucose: 134 mg/dL — ABNORMAL HIGH (ref 65–99)
Potassium: 4.2 mmol/L (ref 3.5–5.2)
Sodium: 140 mmol/L (ref 134–144)
Total Protein: 6.5 g/dL (ref 6.0–8.5)

## 2019-12-05 LAB — PSA: Prostate Specific Ag, Serum: 2.1 ng/mL (ref 0.0–4.0)

## 2019-12-05 LAB — URIC ACID: Uric Acid: 5 mg/dL (ref 3.8–8.4)

## 2019-12-05 LAB — HEMOGLOBIN A1C
Est. average glucose Bld gHb Est-mCnc: 180 mg/dL
Hgb A1c MFr Bld: 7.9 % — ABNORMAL HIGH (ref 4.8–5.6)

## 2019-12-05 LAB — MICROALBUMIN / CREATININE URINE RATIO
Creatinine, Urine: 135.9 mg/dL
Microalb/Creat Ratio: 2 mg/g creat (ref 0–29)
Microalbumin, Urine: 3.2 ug/mL

## 2019-12-05 LAB — LIPID PANEL
Chol/HDL Ratio: 6 ratio — ABNORMAL HIGH (ref 0.0–5.0)
Cholesterol, Total: 254 mg/dL — ABNORMAL HIGH (ref 100–199)
HDL: 42 mg/dL (ref >39)
LDL Chol Calc (NIH): 190 mg/dL — ABNORMAL HIGH (ref 0–99)
Triglycerides: 122 mg/dL (ref 0–149)
VLDL Cholesterol Cal: 22 mg/dL (ref 5–40)

## 2019-12-05 MED ORDER — ROSUVASTATIN CALCIUM 10 MG PO TABS: 10.0000 mg | ORAL_TABLET | Freq: Every day | ORAL | 3 refills | Status: DC

## 2019-12-05 MED ORDER — METFORMIN HCL 500 MG PO TABS: 500.0000 mg | ORAL_TABLET | Freq: Two times a day (BID) | ORAL | 1 refills | Status: DC

## 2019-12-25 ENCOUNTER — Ambulatory Visit (AMBULATORY_SURGERY_CENTER): Payer: Self-pay

## 2019-12-25 ENCOUNTER — Other Ambulatory Visit: Payer: Self-pay

## 2019-12-25 VITALS — Ht 70.0 in | Wt 202.0 lb

## 2019-12-25 DIAGNOSIS — Z1211 Encounter for screening for malignant neoplasm of colon: Secondary | ICD-10-CM

## 2019-12-25 NOTE — Progress Notes (Signed)
No egg or soy allergy known to patient  No issues with past sedation with any surgeries or procedures no intubation problems in the past  No diet pills per patient No home 02 use per patient  No blood thinners per patient  Pt denies issues with constipation  No A fib or A flutter  EMMI video to pt or MyChart  COVID 19 guidelines implemented in [facility] today    pt has been vaccinated for covid.  Due to the COVID-19 pandemic we are asking patients to follow these guidelines. Please only bring one care partner. Please be aware that your care partner may wait in the car in the parking lot or if they feel like they will be too hot to wait in the car, they may wait in the lobby on the 4th floor. All care partners are required to wear a mask the entire time (we do not have any that we can provide them), they need to practice social distancing, and we will do a Covid check for all patient's and care partners when you arrive. Also we will check their temperature and your temperature. If the care partner waits in their car they need to stay in the parking lot the entire time and we will call them on their cell phone when the patient is ready for discharge so they can bring the car to the front of the building. Also all patient's will need to wear a mask into building.

## 2019-12-26 ENCOUNTER — Encounter: Payer: Self-pay | Admitting: Internal Medicine

## 2020-01-14 ENCOUNTER — Other Ambulatory Visit: Payer: Self-pay

## 2020-01-14 ENCOUNTER — Ambulatory Visit (AMBULATORY_SURGERY_CENTER): Payer: Commercial Managed Care - PPO | Admitting: Internal Medicine

## 2020-01-14 ENCOUNTER — Encounter: Payer: Self-pay | Admitting: Internal Medicine

## 2020-01-14 VITALS — BP 113/81 | HR 61 | Temp 97.3°F | Resp 13 | Ht 70.0 in | Wt 202.0 lb

## 2020-01-14 DIAGNOSIS — D125 Benign neoplasm of sigmoid colon: Secondary | ICD-10-CM

## 2020-01-14 DIAGNOSIS — Z1211 Encounter for screening for malignant neoplasm of colon: Secondary | ICD-10-CM | POA: Diagnosis present

## 2020-01-14 MED ORDER — SODIUM CHLORIDE 0.9 % IV SOLN: 500.0000 mL | Freq: Once | INTRAVENOUS | Status: DC

## 2020-01-14 NOTE — Progress Notes (Signed)
Pt. Reports no change in his medical or surgical history since his pre-visit 12/25/19.

## 2020-01-14 NOTE — Op Note (Signed)
[facility] Patient Name: Daniel Stevens Procedure Date: 01/14/2020 11:27 AM MRN: [id number] Endoscopist: Gatha Mayer , MD Age: 59 Referring MD:  Date of Birth: Feb 09, 1961 Gender: Male Account #: [id number] Procedure:                Colonoscopy Indications:              Screening for colorectal malignant neoplasm, This                            is the patient's first colonoscopy Medicines:                Propofol per Anesthesia, Monitored Anesthesia Care Procedure:                Pre-Anesthesia Assessment:                           - Prior to the procedure, a History and Physical                            was performed, and patient medications and                            allergies were reviewed. The patient's tolerance of                            previous anesthesia was also reviewed. The risks                            and benefits of the procedure and the sedation                            options and risks were discussed with the patient.                            All questions were answered, and informed consent                            was obtained. Prior Anticoagulants: The patient has                            taken no previous anticoagulant or antiplatelet                            agents. ASA Grade Assessment: II - A patient with                            mild systemic disease. After reviewing the risks                            and benefits, the patient was deemed in                            satisfactory condition to undergo the procedure.  After obtaining informed consent, the colonoscope                            was passed under direct vision. Throughout the                            procedure, the patient's blood pressure, pulse, and                            oxygen saturations were monitored continuously. The                            Colonoscope was introduced through the anus and                             advanced to the the cecum, identified by                            appendiceal orifice and ileocecal valve. The                            colonoscopy was performed without difficulty. The                            patient tolerated the procedure well. The quality                            of the bowel preparation was excellent. The                            ileocecal valve, appendiceal orifice, and rectum                            were photographed. The bowel preparation used was                            Miralax via split dose instruction. Scope In: 11:39:54 AM Scope Out: 12:02:49 PM Scope Withdrawal Time: 0 hours 19 minutes 26 seconds  Total Procedure Duration: 0 hours 22 minutes 55 seconds  Findings:                 The perianal and digital rectal examinations were                            normal. Pertinent negatives include normal prostate                            (size, shape, and consistency).                           A 8 mm polyp was found in the sigmoid colon. The                            polyp was pedunculated. The polyp was removed with  a hot snare. Resection and retrieval were complete.                            Verification of patient identification for the                            specimen was done. Estimated blood loss: none.                           Many small and large-mouthed diverticula were found                            in the sigmoid colon.                           The exam was otherwise without abnormality on                            direct and retroflexion views. Complications:            No immediate complications. Estimated Blood Loss:     Estimated blood loss: none. Impression:               - One 8 mm polyp in the sigmoid colon, removed with                            a hot snare. Resected and retrieved.                           - Diverticulosis in the sigmoid colon.                           - The examination  was otherwise normal on direct                            and retroflexion views. Recommendation:           - Resume previous diet.                           - Continue present medications.                           - No aspirin, ibuprofen, naproxen, or other                            non-steroidal anti-inflammatory drugs for 2 weeks                            after polyp removal.                           - Repeat colonoscopy is recommended. The                            colonoscopy date will be determined after pathology  results from today's exam become available for                            review. Gatha Mayer, MD 01/14/2020 12:08:23 PM This report has been signed electronically.

## 2020-01-14 NOTE — Patient Instructions (Addendum)
I found and removed one small polyp.  I will let you know pathology results and when to have another routine colonoscopy by mail and/or My Chart.  You also have a condition called diverticulosis - common and not usually a problem. Please read the handout provided.  I appreciate the opportunity to care for you. Gatha Mayer, MD, [facility]  Polyp handout given to patient. Diverticulosis handout given to patient.  No aspirin, ibuprofen, naproxen, or other NSAID drugs for 2 weeks.  Tylenol only until 01/28/2020.  YOU HAD AN ENDOSCOPIC PROCEDURE TODAY AT [facility]:   Refer to the procedure report that was given to you for any specific questions about what was found during the examination.  If the procedure report does not answer your questions, please call your gastroenterologist to clarify.  If you requested that your care partner not be given the details of your procedure findings, then the procedure report has been included in a sealed envelope for you to review at your convenience later.  YOU SHOULD EXPECT: Some feelings of bloating in the abdomen. Passage of more gas than usual.  Walking can help get rid of the air that was put into your GI tract during the procedure and reduce the bloating. If you had a lower endoscopy (such as a colonoscopy or flexible sigmoidoscopy) you may notice spotting of blood in your stool or on the toilet paper. If you underwent a bowel prep for your procedure, you may not have a normal bowel movement for a few days.  Please Note:  You might notice some irritation and congestion in your nose or some drainage.  This is from the oxygen used during your procedure.  There is no need for concern and it should clear up in a day or so.  SYMPTOMS TO REPORT IMMEDIATELY:   Following lower endoscopy (colonoscopy or flexible sigmoidoscopy):  Excessive amounts of blood in the stool  Significant tenderness or worsening of abdominal pains  Swelling of the abdomen  that is new, acute  Fever of 100F or higher For urgent or emergent issues, a gastroenterologist can be reached at any hour by calling [phone]. Do not use MyChart messaging for urgent concerns.    DIET:  We do recommend a small meal at first, but then you may proceed to your regular diet.  Drink plenty of fluids but you should avoid alcoholic beverages for 24 hours.  ACTIVITY:  You should plan to take it easy for the rest of today and you should NOT DRIVE or use heavy machinery until tomorrow (because of the sedation medicines used during the test).    FOLLOW UP: Our staff will call the number listed on your records 48-72 hours following your procedure to check on you and address any questions or concerns that you may have regarding the information given to you following your procedure. If we do not reach you, we will leave a message.  We will attempt to reach you two times.  During this call, we will ask if you have developed any symptoms of COVID 19. If you develop any symptoms (ie: fever, flu-like symptoms, shortness of breath, cough etc.) before then, please call [phone].  If you test positive for Covid 19 in the 2 weeks post procedure, please call and report this information to [place].    If any biopsies were taken you will be contacted by phone or by letter within the next 1-3 weeks.  Please call us at [phone] if  you have not heard about the biopsies in 3 weeks.    SIGNATURES/CONFIDENTIALITY: You and/or your care partner have signed paperwork which will be entered into your electronic medical record.  These signatures attest to the fact that that the information above on your After Visit Summary has been reviewed and is understood.  Full responsibility of the confidentiality of this discharge information lies with you and/or your care-partner.

## 2020-01-14 NOTE — Progress Notes (Signed)
Report given to PACU, vss 

## 2020-01-14 NOTE — Progress Notes (Signed)
Called to room to assist during endoscopic procedure.  Patient ID and intended procedure confirmed with present staff. Received instructions for my participation in the procedure from the performing physician.  

## 2020-01-16 ENCOUNTER — Telehealth: Payer: Self-pay

## 2020-01-16 NOTE — Telephone Encounter (Signed)
Left message on answering machine. 

## 2020-01-21 ENCOUNTER — Encounter: Payer: Self-pay | Admitting: Internal Medicine

## 2020-01-21 DIAGNOSIS — Z86010 Personal history of colonic polyps: Secondary | ICD-10-CM

## 2020-01-21 DIAGNOSIS — Z860101 Personal history of adenomatous and serrated colon polyps: Secondary | ICD-10-CM

## 2020-01-21 HISTORY — DX: Personal history of colonic polyps: Z86.010

## 2020-01-21 HISTORY — DX: Personal history of adenomatous and serrated colon polyps: Z86.0101

## 2020-02-26 ENCOUNTER — Telehealth: Payer: Self-pay | Admitting: Medical

## 2020-02-26 NOTE — Telephone Encounter (Signed)
Pt called and wanted to now who Audelia Acton usually recommended for marriage counseling. Please advise at [phone]. Sending to Delfino Lovett as Audelia Acton is out of office.

## 2020-02-28 NOTE — Telephone Encounter (Signed)
Daniel Stevens, can you please advise whom patient should use for marriage counseling

## 2020-02-28 NOTE — Telephone Encounter (Signed)
I recommend [facility]ecton but any of these would be good options. Counseling Services (NON- psychiatrist offices)  Dr. George Hugh, [place] [phone] [place], [place] [address]   [facility] [address] [place], [place] [address] [phone]   [facility] [phone] [place], [place], [place] [address]   [facility] [phone]  [email or web address] [address] [place], [place], [place] [address]   [facility] M. Clarene Reamer, therapist [phone] [address] [place], [place] [address]   [facility] [phone] [address], [place], [place] [address]   Vic Ripper, therapist [phone] [address], [place], [place] [address]   [facility] [phone] [address] [place], [place], [place] [address]   [facility] [address] [place], [place] [address] Crisis services, Family support, in home therapy, treatment for Anxiety, PTSD, Sexual Assault, Substance Abuse, Financial/Credit Counseling, Variety of other services

## 2020-02-29 NOTE — Telephone Encounter (Signed)
Message has been sent to patient via mychart  

## 2020-03-10 ENCOUNTER — Ambulatory Visit: Payer: Commercial Managed Care - PPO | Admitting: Medical

## 2020-03-11 ENCOUNTER — Ambulatory Visit: Payer: Commercial Managed Care - PPO | Admitting: Medical

## 2020-03-12 ENCOUNTER — Other Ambulatory Visit: Payer: Self-pay

## 2020-03-12 ENCOUNTER — Encounter: Payer: Self-pay | Admitting: Medical

## 2020-03-12 ENCOUNTER — Ambulatory Visit: Payer: Commercial Managed Care - PPO | Admitting: Medical

## 2020-03-12 VITALS — BP 130/78 | HR 75 | Ht 70.0 in | Wt 205.0 lb

## 2020-03-12 DIAGNOSIS — E785 Hyperlipidemia, unspecified: Secondary | ICD-10-CM | POA: Diagnosis not present

## 2020-03-12 DIAGNOSIS — M25551 Pain in right hip: Secondary | ICD-10-CM

## 2020-03-12 DIAGNOSIS — E118 Type 2 diabetes mellitus with unspecified complications: Secondary | ICD-10-CM

## 2020-03-12 DIAGNOSIS — M79604 Pain in right leg: Secondary | ICD-10-CM | POA: Diagnosis not present

## 2020-03-12 MED ORDER — HYDROCODONE-ACETAMINOPHEN 5-325 MG PO TABS
1.0000 | ORAL_TABLET | Freq: Four times a day (QID) | ORAL | 0 refills | Status: DC | PRN
Start: 2020-03-12 — End: 2020-03-24

## 2020-03-12 NOTE — Progress Notes (Signed)
Established patient visit   Patient: Daniel Stevens   DOB: 02-Jan-1961   59 y.o. Male  MRN: [id number] Visit Date: 03/12/2020  Today's healthcare provider: Dorothea Ogle, PA-C   Chief Complaint  Patient presents with  . Sciatica    upper thigh radiating up buttock   I,Daniel Stevens,acting as a scribe for [employer], PA-C.,have documented all relevant documentation on his behalf,as directed and in the presence of Daniel Stevens.  Subjective    HPI HPI    Sciatica     Additional comments: upper thigh radiating up buttock        Last edited by Daniel Stevens, Daniel Stevens on 03/12/2020  2:12 PM. (History)      Thigh Pain Patient presents today for bilateral thigh pain that radiates to buttocks for about 1 week now. Patient reports that the pain shoot upwards from the right hip/ thigh, gives shock waves. Patient denies any back pain, falls, fights, fevers, abdominal pain, no problem peeing or bowel movements, no bleed in urine. Have taking some ibuprofen.  No numbness, no tingling.  No fall  Rotated the hips, patient reports some pain.  He is not compliant with his statin currently  Medications: Outpatient Medications Prior to Visit  Medication Sig  . metFORMIN (GLUCOPHAGE) 500 MG tablet Take 1 tablet (500 mg total) by mouth 2 (two) times daily with a meal.  . ibuprofen (ADVIL,MOTRIN) 200 MG tablet Take 2 tablets (400 mg total) by mouth 3 (three) times daily. (Patient not taking: Reported on 01/14/2020)  . rosuvastatin (CRESTOR) 10 MG tablet Take 1 tablet (10 mg total) by mouth daily. (Patient not taking: Reported on 12/25/2019)   No facility-administered medications prior to visit.    Review of Systems As in subjective   Objective    BP 130/78   Pulse 75   Ht 5\' 10"  (1.778 m)   Wt 205 lb (93 kg)   SpO2 96%   BMI 29.41 kg/m    Physical Exam Constitutional:      Appearance: Normal appearance.  Abdominal:     General: Abdomen is flat. There is no  distension.     Palpations: Abdomen is soft. There is no mass.     Tenderness: There is no abdominal tenderness.     Hernia: No hernia is present.  Musculoskeletal:     Cervical back: No swelling or tenderness.     Thoracic back: No swelling or tenderness. Normal range of motion.     Lumbar back: No swelling or tenderness. Normal range of motion.     Right upper leg: No swelling, edema, deformity or tenderness.     Left upper leg: No swelling, edema, deformity or tenderness.     Comments: Right leg with no obvious tenderness or swelling or deformity.  Right leg decreased internal and external ROM with pain.No trochanteric tenderness and left leg.  Rest of leg exam and left leg exam unremarkable  Skin:    General: Skin is warm and dry.  Neurological:     General: No focal deficit present.     Mental Status: He is alert and oriented to person, place, and time.     Comments: Normal heel and toe walk, normal leg strength and sensation, normal DTRs  Psychiatric:        Mood and Affect: Mood normal.        Behavior: Behavior normal.         Assessment & Plan   Encounter Diagnoses  Name Primary?  Marland Kitchen  Right hip pain Yes  . Right leg pain   . Type 2 diabetes mellitus with complication, without long-term current use of insulin ([place])   . Hyperlipidemia, unspecified hyperlipidemia type        We discussed possible differential which could include sciatica but more likely hip arthritis.  We will send for x-ray.  Begin short-term medication below as needed, relative rest, stretching.  Patient Instructions  Go for x-ray of right hip  Please go to [facility] for your hip xray.   Their hours are 8am - 4:30 pm Monday - Friday.  Take your insurance card with you.  [facility] [phone]  [address], [place], [place] [address]  [address], [place] [address]   Begin hydrocodone as needed short-term for worse pain. Caution with sedation and  constipation  If the pain is not so bad just use Tylenol over-the-counter  We will call with x-ray results    Daniel Stevens was seen today for sciatica.  Diagnoses and all orders for this visit:  Right hip pain -     DG HIP UNILAT WITH PELVIS 2-3 VIEWS RIGHT; Future  Right leg pain -     DG HIP UNILAT WITH PELVIS 2-3 VIEWS RIGHT; Future  Type 2 diabetes mellitus with complication, without long-term current use of insulin (H[facility])  Hyperlipidemia, unspecified hyperlipidemia type  Other orders -     HYDROcodone-acetaminophen (NORCO) 5-325 MG tablet; Take 1 tablet by mouth every 6 (six) hours as needed.  Advise he return soon for routine diabetes follow-up.  Discussed need for compliance with medications   Daniel Stevens, Daniel Stevens  Daniel Stevens [phone] (phone) [phone] (fax)  [place]

## 2020-03-12 NOTE — Patient Instructions (Signed)
Go for x-ray of right hip  Please go to [facility] for your hip xray.   Their hours are 8am - 4:30 pm Monday - Friday.  Take your insurance card with you.  [facility] [phone]  [address], [place], [place] [address]  [address], [place] [address]   Begin hydrocodone as needed short-term for worse pain. Caution with sedation and constipation  If the pain is not so bad just use Tylenol over-the-counter  We will call with x-ray results

## 2020-03-24 ENCOUNTER — Other Ambulatory Visit: Payer: Self-pay

## 2020-03-24 ENCOUNTER — Ambulatory Visit (INDEPENDENT_AMBULATORY_CARE_PROVIDER_SITE_OTHER): Payer: Commercial Managed Care - PPO | Admitting: Medical

## 2020-03-24 ENCOUNTER — Encounter: Payer: Self-pay | Admitting: Medical

## 2020-03-24 VITALS — BP 126/94 | HR 91 | Ht 70.0 in | Wt 199.8 lb

## 2020-03-24 DIAGNOSIS — M79604 Pain in right leg: Secondary | ICD-10-CM | POA: Insufficient documentation

## 2020-03-24 DIAGNOSIS — E785 Hyperlipidemia, unspecified: Secondary | ICD-10-CM

## 2020-03-24 DIAGNOSIS — R42 Dizziness and giddiness: Secondary | ICD-10-CM | POA: Diagnosis not present

## 2020-03-24 DIAGNOSIS — M25551 Pain in right hip: Secondary | ICD-10-CM

## 2020-03-24 DIAGNOSIS — H6690 Otitis media, unspecified, unspecified ear: Secondary | ICD-10-CM | POA: Diagnosis not present

## 2020-03-24 DIAGNOSIS — E118 Type 2 diabetes mellitus with unspecified complications: Secondary | ICD-10-CM

## 2020-03-24 DIAGNOSIS — H7291 Unspecified perforation of tympanic membrane, right ear: Secondary | ICD-10-CM | POA: Diagnosis not present

## 2020-03-24 MED ORDER — AMOXICILLIN-POT CLAVULANATE 875-125 MG PO TABS: 1.0000 | ORAL_TABLET | Freq: Two times a day (BID) | ORAL | 0 refills | Status: DC

## 2020-03-24 MED ORDER — ROSUVASTATIN CALCIUM 10 MG PO TABS: 10.0000 mg | ORAL_TABLET | Freq: Every day | ORAL | 3 refills | Status: DC

## 2020-03-24 MED ORDER — METFORMIN HCL 500 MG PO TABS: 500.0000 mg | ORAL_TABLET | Freq: Two times a day (BID) | ORAL | 0 refills | Status: DC

## 2020-03-24 MED ORDER — HYDROCODONE-ACETAMINOPHEN 5-325 MG PO TABS: 1.0000 | ORAL_TABLET | Freq: Four times a day (QID) | ORAL | 0 refills | Status: DC | PRN

## 2020-03-24 NOTE — Patient Instructions (Signed)
Please go to [facility] for your leg xray.   Their hours are 8am - 4:30 pm Monday - Friday.  Take your insurance card with you.  [facility] [phone]  [place] [address], [place], [place] [address]  [address] [place], [place] [address]

## 2020-03-24 NOTE — Progress Notes (Signed)
Subjective:  Daniel Stevens is a 59 y.o. male who presents for Chief Complaint  Patient presents with  . Dizziness     Here for not feeling well, dizziness. For the past day has felt dizzy like room spinning, loopy feeling.  No ringing in ears.   Does have blurry vision x 2 days.   Hasn't been wearing glasses. having some left ear pain.  No sore tthroat no ssinusesppressure no cough.  No fever.  No sneezing.  Did feel some pop in right ear 2 days ago when clearing sinuses.    No polyuria, no polydipsia.   No numbness or tingling.   No confusion, no speech changes.   No chest pain, no shortness of breath.  Hasn't been sleeping well since Friday due to his ongoing right leg pain.   Ran out of the pain medication since last visit.   Still having the same right leg pain he came in for on 03/12/20.  Not sleeping well in part due to wife.   Wife is convinced they have mice in the walls.  She keeps awakening him in the middle of the night.  They have had 2 different pest control folks out at the house and can't seem to find any mice yet.    Last checked glucose Thursday, was 167.  No other aggravating or relieving factors.    No other c/o.  The following portions of the patient's history were reviewed and updated as appropriate: allergies, current medications, past family history, past medical history, past social history, past surgical history and problem list.  ROS Otherwise as in subjective above  Objective: BP (!) 126/94   Pulse 91   Ht 5\' 10"  (1.778 m)   Wt 199 lb 12.8 oz (90.6 kg)   SpO2 94%   BMI 28.67 kg/m   General appearance: alert, no distress, well developed, well nourished HEENT: normocephalic, sclerae anicteric, conjunctiva pink and moist, left TM with erythema and retracted, right TM with perforation and erythema, nares patent, no discharge or erythema, pharynx normal Oral cavity: MMM, no lesions Neck: supple, no lymphadenopathy, no thyromegaly, no masses Heart: RRR,  normal S1, S2, no murmurs Lungs: CTA bilaterally, no wheezes, rhonchi, or rales Neuro: CN II through XII intact, no cerebellar signs, nonfocal exam Psych: Pleasant, answers questions appropriate, Pulses: 2+ radial pulses, 2+ pedal pulses, normal cap refill Ext: no edema   Assessment: Encounter Diagnoses  Name Primary?  . Acute otitis media, unspecified otitis media type Yes  . Dizziness   . Perforated ear drum, right   . Right leg pain   . Right hip pain   . Type 2 diabetes mellitus with complication, without long-term current use of insulin ([place])   . Hyperlipidemia, unspecified hyperlipidemia type      Plan: Begin Augmentin for otitis, can use cotton ball and right external ear to protect ear temporarily, recheck in 2 weeks.  Do not drive or too dizzy.  Work note given for the next 2 days  Right leg pain-go for x-rays plan, short-term pain medicine given  Diabetes-continue Metformin, return soon for routine follow-up  Dyslipidemia-continue statin, return soon for routine med check   [name] was seen today for dizziness.  Diagnoses and all orders for this visit:  Acute otitis media, unspecified otitis media type  Dizziness  Perforated ear drum, right  Right leg pain  Right hip pain  Type 2 diabetes mellitus with complication, without long-term current use of insulin (HCC)  Hyperlipidemia, unspecified hyperlipidemia type  Other orders -     rosuvastatin (CRESTOR) 10 MG tablet; Take 1 tablet (10 mg total) by mouth daily. -     metFORMIN (GLUCOPHAGE) 500 MG tablet; Take 1 tablet (500 mg total) by mouth 2 (two) times daily with a meal. -     HYDROcodone-acetaminophen (NORCO) 5-325 MG tablet; Take 1 tablet by mouth every 6 (six) hours as needed. -     amoxicillin-clavulanate (AUGMENTIN) 875-125 MG tablet; Take 1 tablet by mouth 2 (two) times daily.    Follow up: pending xray, soon for regular med check

## 2020-06-10 ENCOUNTER — Other Ambulatory Visit: Payer: Self-pay

## 2020-06-10 ENCOUNTER — Telehealth: Payer: Commercial Managed Care - PPO | Admitting: Family Medicine

## 2020-06-10 ENCOUNTER — Other Ambulatory Visit (INDEPENDENT_AMBULATORY_CARE_PROVIDER_SITE_OTHER): Payer: Commercial Managed Care - PPO

## 2020-06-10 ENCOUNTER — Telehealth: Payer: Self-pay

## 2020-06-10 ENCOUNTER — Encounter: Payer: Self-pay | Admitting: Family Medicine

## 2020-06-10 VITALS — Ht 70.5 in | Wt 199.0 lb

## 2020-06-10 DIAGNOSIS — U071 COVID-19: Secondary | ICD-10-CM

## 2020-06-10 DIAGNOSIS — J029 Acute pharyngitis, unspecified: Secondary | ICD-10-CM

## 2020-06-10 LAB — POC COVID19 BINAXNOW: SARS Coronavirus 2 Ag: POSITIVE — AB

## 2020-06-10 LAB — POCT RAPID STREP A (OFFICE): Rapid Strep A Screen: NEGATIVE

## 2020-06-10 NOTE — Telephone Encounter (Signed)
[facility] rapid test was positive for COVID. He has a sore throat and stated ibuprofen does not help. Please advise what patient can take for sore throat. Strep test was negative.

## 2020-06-10 NOTE — Progress Notes (Addendum)
   Subjective:    Patient ID: Daniel Stevens, male    DOB: 08-Nov-1960, 60 y.o.   MRN: [id number]  HPI I connected with  Daniel Stevens on 06/10/20 by a video enabled telemedicine application and verified that I am speaking with the correct person using two identifiers.  Caregility used I am in my office.  Patient in his car. I discussed the limitations of evaluation and management by telemedicine. The patient expressed understanding and agreed to proceed. He states that on Wednesday he developed a slight sore throat and cough but no fever, chills, earache, shortness of breath.  He is concerned about strep throat.   Review of Systems     Objective:   Physical Exam Alert and in no distress with normal breathing pattern.       Assessment & Plan:  Sore throat - Plan: POC COVID-19, Novel Coronavirus, NAA ([facility]), Rapid Strep A  COVID-19 Explained that we will do the rapid screening and proceed to PCR if needed.  Discussed conservative care for his symptoms. 20 minutes spent in reviewing his record today, counseling, coordination of care and documentation.   His rapid test came back positive.  I explained that he needs to stay sequestered for at least 5 days.  His only symptom right now is sore throat.  Recommend 800 mg ibuprofen 3 times per day as well as gargling.

## 2020-06-12 ENCOUNTER — Telehealth: Payer: Self-pay | Admitting: Medical

## 2020-06-12 MED ORDER — TRAMADOL HCL 50 MG PO TABS: 50.0000 mg | ORAL_TABLET | Freq: Three times a day (TID) | ORAL | 0 refills | Status: AC | PRN

## 2020-06-12 NOTE — Telephone Encounter (Signed)
Let him know that I called something in

## 2020-06-12 NOTE — Telephone Encounter (Signed)
Pt called back today and said his throat is still hurting really bad and wants to see if he can get something called in. He tested positive for Covid on 1/11

## 2020-06-13 ENCOUNTER — Telehealth: Payer: Self-pay

## 2020-06-13 NOTE — Telephone Encounter (Signed)
He is passed a 5-day timeframe and is having no symptoms so he can return to work.  Give him a note

## 2020-06-13 NOTE — Telephone Encounter (Signed)
Pt tested positive Tues, Sx started last Saturday, feels better and wants to go back to work on Saturday.  Can he be released to go back to work tomorrow?  He needs note for work.

## 2020-06-13 NOTE — Telephone Encounter (Signed)
Note written for pt.

## 2020-06-16 ENCOUNTER — Telehealth: Payer: Self-pay

## 2020-06-16 NOTE — Telephone Encounter (Signed)
error 

## 2020-06-16 NOTE — Telephone Encounter (Signed)
done

## 2020-06-23 ENCOUNTER — Encounter: Payer: Self-pay | Admitting: Medical

## 2020-06-23 ENCOUNTER — Ambulatory Visit: Payer: Commercial Managed Care - PPO | Admitting: Medical

## 2020-06-23 ENCOUNTER — Other Ambulatory Visit: Payer: Self-pay

## 2020-06-23 VITALS — BP 132/88 | HR 78 | Ht 70.0 in | Wt 200.8 lb

## 2020-06-23 DIAGNOSIS — L0591 Pilonidal cyst without abscess: Secondary | ICD-10-CM | POA: Diagnosis not present

## 2020-06-23 DIAGNOSIS — K611 Rectal abscess: Secondary | ICD-10-CM | POA: Diagnosis not present

## 2020-06-23 DIAGNOSIS — K6289 Other specified diseases of anus and rectum: Secondary | ICD-10-CM

## 2020-06-23 DIAGNOSIS — E118 Type 2 diabetes mellitus with unspecified complications: Secondary | ICD-10-CM

## 2020-06-23 LAB — CBC WITH DIFFERENTIAL/PLATELET
Basophils Absolute: 0 10*3/uL (ref 0.0–0.2)
Basos: 0 %
EOS (ABSOLUTE): 0.1 10*3/uL (ref 0.0–0.4)
Eos: 1 %
Hematocrit: 39.9 % (ref 37.5–51.0)
Hemoglobin: 13.3 g/dL (ref 13.0–17.7)
Immature Grans (Abs): 0 10*3/uL (ref 0.0–0.1)
Immature Granulocytes: 1 %
Lymphocytes Absolute: 2.3 10*3/uL (ref 0.7–3.1)
Lymphs: 28 %
MCH: 29.4 pg (ref 26.6–33.0)
MCHC: 33.3 g/dL (ref 31.5–35.7)
MCV: 88 fL (ref 79–97)
Monocytes Absolute: 0.6 10*3/uL (ref 0.1–0.9)
Monocytes: 7 %
Neutrophils Absolute: 5.3 10*3/uL (ref 1.4–7.0)
Neutrophils: 63 %
Platelets: 249 10*3/uL (ref 150–450)
RBC: 4.52 x10E6/uL (ref 4.14–5.80)
RDW: 13.9 % (ref 11.6–15.4)
WBC: 8.4 10*3/uL (ref 3.4–10.8)

## 2020-06-23 LAB — HEMOGLOBIN A1C
Est. average glucose Bld gHb Est-mCnc: 180 mg/dL
Hgb A1c MFr Bld: 7.9 % — ABNORMAL HIGH (ref 4.8–5.6)

## 2020-06-23 MED ORDER — AMOXICILLIN-POT CLAVULANATE 875-125 MG PO TABS: 1.0000 | ORAL_TABLET | Freq: Two times a day (BID) | ORAL | 0 refills | Status: DC

## 2020-06-23 MED ORDER — DOCUSATE SODIUM 100 MG PO CAPS: 100.0000 mg | ORAL_CAPSULE | Freq: Two times a day (BID) | ORAL | 0 refills | Status: DC

## 2020-06-23 MED ORDER — CEFTRIAXONE SODIUM 1 G IJ SOLR
1.0000 g | Freq: Once | INTRAMUSCULAR | Status: AC
  Administered 2020-06-23: 1 g via INTRAMUSCULAR

## 2020-06-23 MED ORDER — HYDROCODONE-ACETAMINOPHEN 5-325 MG PO TABS: 1.0000 | ORAL_TABLET | Freq: Four times a day (QID) | ORAL | 0 refills | Status: DC | PRN

## 2020-06-23 NOTE — Progress Notes (Signed)
Subjective: Chief Complaint  Patient presents with  . Hemorrhoids    Very painful.    Here for painful hemorrhoid?  Been having some trouble with bowels, straining, pain with bowels.  Been doing some yard work, so not sure if flared things up.  No prior hemorrhoids painful like this.  No bleeding.  Been soaking in tub.  No nausea, no vomiting, no body aches or chills.   No other aggravating or relieving factors.    No other c/o.  The following portions of the patient's history were reviewed and updated as appropriate: allergies, current medications, past family history, past medical history, past social history, past surgical history and problem list.  ROS Otherwise as in subjective above    Objective: BP 132/88   Pulse 78   Ht 5\' 10"  (1.778 m)   Wt 200 lb 12.8 oz (91.1 kg)   SpO2 97%   BMI 28.81 kg/m   General appearance: alert, no distress, well developed, well nourished Left perirectal swelling and pinkish red coloration, tender and swollen, slight induration but no fluctuance, no obvious hemorrhoid    Assessment: Encounter Diagnoses  Name Primary?  . Perirectal cellulitis Yes  . Pilonidal cyst   . Rectal pain   . Type 2 diabetes mellitus with complication, without long-term current use of insulin ([place])      Plan: We discussed his findings.  Rocephin 1 g IM today.  Begin Augmentin antibiotic, warm bath soaks.  We discussed that this could get worse in the coming days.  If fever, body aches, chills, nausea, vomiting or worse swelling or pain then recheck or go to the emergency department.  He also has a pilonidal cyst.  Not sure if this is communicating with the perirectal region.  He does note that the pilonidal cyst area drains maybe once or twice per year  Gave instructions below.  Follow-up in 2 to 3 days   Patient Instructions   Encounter Diagnoses  Name Primary?  . Perirectal cellulitis Yes  . Pilonidal cyst   . Rectal pain   . Type 2 diabetes  mellitus with complication, without long-term current use of insulin (HCC)      Begin Augmentin antibiotic x 10 days  You can use the pain medicaiton as needed  Use colace stool softeners the next few days  If in the next 3-4 days, if fever over 101, worse pain or swelling at the rectum, drainage from the area, or ill feeling such as nausea, sick to your stomach, uneasy feeling, then recheck or go to the emergency department.     Anorectal Abscess An abscess is an infected area that contains a collection of pus. An anorectal abscess is an abscess that is near the opening of the anus or around the rectum. Without treatment, an anorectal abscess can become larger and cause other problems, such as a more serious body-wide infection or pain, especially during bowel movements. What are the causes? This condition is caused by plugged glands or an infection in one of these areas:  The anus.  The area between the anus and the scrotum in males or between the anus and the vagina in females (perineum). What increases the risk? The following factors may make you more likely to develop this condition:  Diabetes or inflammatory bowel disease.  Having a body defense system (immune system) that is weak.  Engaging in anal sex.  Having a sexually transmitted infection (STI).  Certain kinds of cancer, such as rectal carcinoma, leukemia,  or lymphoma. What are the signs or symptoms? The main symptom of this condition is pain. The pain may be a throbbing pain that gets worse during bowel movements. Other symptoms include:  Swelling and redness in the area of the abscess. The redness may go beyond the abscess and appear as a red streak on the skin.  A visible, painful lump, or a lump that can be felt when touched.  Bleeding or pus-like discharge from the area.  Fever.  General weakness.  Constipation.  Diarrhea. How is this diagnosed? This condition is diagnosed based on your medical  history and a physical exam of the affected area.  This may involve examining the rectal area with a gloved hand (digital rectal exam).  Sometimes, the health care provider needs to look into the rectum using a probe, scope, or imaging test.  For women, it may require a careful vaginal exam. How is this treated? Treatment for this condition may include:  Incision and drainage surgery. This involves making an incision over the abscess to drain the pus.  Medicines, including antibiotic medicine, pain medicine, stool softeners, or laxatives. Follow these instructions at home: Medicines  Take over-the-counter and prescription medicines only as told by your health care provider.  If you were prescribed an antibiotic medicine, use it as told by your health care provider. Do not stop using the antibiotic even if you start to feel better.  Do not drive or use heavy machinery while taking prescription pain medicine. Wound care  If gauze was used in the abscess, follow instructions from your health care provider about removing or changing the gauze. It can usually be removed in 2-3 days.  Wash your hands with soap and water before you remove or change your gauze. If soap and water are not available, use hand sanitizer.  If one or more drains were placed in the abscess cavity, be careful not to pull at them. Your health care provider will tell you how long they need to remain in place.  Check your incision area every day for signs of infection. Check for: ? More redness, swelling, or pain. ? More fluid or blood. ? Warmth. ? Pus or a bad smell.   Managing pain, stiffness, and swelling  Take a sitz bath 3-4 times a day and after bowel movements. This will help reduce pain and swelling.  To relieve pain, try sitting: ? On a heating pad with the setting on low. ? On an inflatable donut-shaped cushion.  If directed, put ice on the affected area: ? Put ice in a plastic bag. ? Place a towel  between your skin and the bag. ? Leave the ice on for 20 minutes, 2-3 times a day.   General instructions  Follow any diet instructions given by your health care provider.  Keep all follow-up visits as told by your health care provider. This is important. Contact a health care provider if you have:  Bleeding from your incision.  Pain, swelling, or redness that does not improve or gets worse.  Trouble passing stool or urine.  Symptoms that return after treatment. Get help right away if you:  Have problems moving or using your legs.  Have severe or increasing pain.  Have swelling in the affected area that suddenly gets worse.  Have a large increase in bleeding or passing of pus.  Develop chills or a fever. Summary  An anorectal abscess is an abscess that is near the opening of the anus or around the  rectum. An abscess is an infected area that contains a collection of pus.  The main symptom of this condition is pain. It may be a throbbing pain that gets worse during bowel movements.  Treatment for an anorectal abscess may include surgery to drain the pus from the abscess. Medicines and sitz baths may also be a part of your treatment plan. This information is not intended to replace advice given to you by your health care provider. Make sure you discuss any questions you have with your health care provider. Document Revised: 06/23/2017 Document Reviewed: 06/23/2017 Elsevier Patient Education  2021 [place] was seen today for hemorrhoids.  Diagnoses and all orders for this visit:  Perirectal cellulitis -     CBC with Differential/Platelet  Pilonidal cyst  Rectal pain -     CBC with Differential/Platelet  Type 2 diabetes mellitus with complication, without long-term current use of insulin (HCC) -     Hemoglobin A1c  Other orders -     amoxicillin-clavulanate (AUGMENTIN) 875-125 MG tablet; Take 1 tablet by mouth 2 (two) times daily. -      HYDROcodone-acetaminophen (NORCO) 5-325 MG tablet; Take 1 tablet by mouth every 6 (six) hours as needed. -     docusate sodium (COLACE) 100 MG capsule; Take 1 capsule (100 mg total) by mouth 2 (two) times daily.    Follow up: 2-3 days

## 2020-06-23 NOTE — Addendum Note (Signed)
Addended by: Edgar Frisk on: 06/23/2020 02:37 PM   Modules accepted: Orders

## 2020-06-23 NOTE — Patient Instructions (Addendum)
Encounter Diagnoses  Name Primary?  . Perirectal cellulitis Yes  . Pilonidal cyst   . Rectal pain   . Type 2 diabetes mellitus with complication, without long-term current use of insulin (HCC)      Begin Augmentin antibiotic x 10 days  You can use the pain medicaiton as needed  Use colace stool softeners the next few days  If in the next 3-4 days, if fever over 101, worse pain or swelling at the rectum, drainage from the area, or ill feeling such as nausea, sick to your stomach, uneasy feeling, then recheck or go to the emergency department.     Anorectal Abscess An abscess is an infected area that contains a collection of pus. An anorectal abscess is an abscess that is near the opening of the anus or around the rectum. Without treatment, an anorectal abscess can become larger and cause other problems, such as a more serious body-wide infection or pain, especially during bowel movements. What are the causes? This condition is caused by plugged glands or an infection in one of these areas:  The anus.  The area between the anus and the scrotum in males or between the anus and the vagina in females (perineum). What increases the risk? The following factors may make you more likely to develop this condition:  Diabetes or inflammatory bowel disease.  Having a body defense system (immune system) that is weak.  Engaging in anal sex.  Having a sexually transmitted infection (STI).  Certain kinds of cancer, such as rectal carcinoma, leukemia, or lymphoma. What are the signs or symptoms? The main symptom of this condition is pain. The pain may be a throbbing pain that gets worse during bowel movements. Other symptoms include:  Swelling and redness in the area of the abscess. The redness may go beyond the abscess and appear as a red streak on the skin.  A visible, painful lump, or a lump that can be felt when touched.  Bleeding or pus-like discharge from the  area.  Fever.  General weakness.  Constipation.  Diarrhea. How is this diagnosed? This condition is diagnosed based on your medical history and a physical exam of the affected area.  This may involve examining the rectal area with a gloved hand (digital rectal exam).  Sometimes, the health care provider needs to look into the rectum using a probe, scope, or imaging test.  For women, it may require a careful vaginal exam. How is this treated? Treatment for this condition may include:  Incision and drainage surgery. This involves making an incision over the abscess to drain the pus.  Medicines, including antibiotic medicine, pain medicine, stool softeners, or laxatives. Follow these instructions at home: Medicines  Take over-the-counter and prescription medicines only as told by your health care provider.  If you were prescribed an antibiotic medicine, use it as told by your health care provider. Do not stop using the antibiotic even if you start to feel better.  Do not drive or use heavy machinery while taking prescription pain medicine. Wound care  If gauze was used in the abscess, follow instructions from your health care provider about removing or changing the gauze. It can usually be removed in 2-3 days.  Wash your hands with soap and water before you remove or change your gauze. If soap and water are not available, use hand sanitizer.  If one or more drains were placed in the abscess cavity, be careful not to pull at them. Your health care provider  will tell you how long they need to remain in place.  Check your incision area every day for signs of infection. Check for: ? More redness, swelling, or pain. ? More fluid or blood. ? Warmth. ? Pus or a bad smell.   Managing pain, stiffness, and swelling  Take a sitz bath 3-4 times a day and after bowel movements. This will help reduce pain and swelling.  To relieve pain, try sitting: ? On a heating pad with the setting  on low. ? On an inflatable donut-shaped cushion.  If directed, put ice on the affected area: ? Put ice in a plastic bag. ? Place a towel between your skin and the bag. ? Leave the ice on for 20 minutes, 2-3 times a day.   General instructions  Follow any diet instructions given by your health care provider.  Keep all follow-up visits as told by your health care provider. This is important. Contact a health care provider if you have:  Bleeding from your incision.  Pain, swelling, or redness that does not improve or gets worse.  Trouble passing stool or urine.  Symptoms that return after treatment. Get help right away if you:  Have problems moving or using your legs.  Have severe or increasing pain.  Have swelling in the affected area that suddenly gets worse.  Have a large increase in bleeding or passing of pus.  Develop chills or a fever. Summary  An anorectal abscess is an abscess that is near the opening of the anus or around the rectum. An abscess is an infected area that contains a collection of pus.  The main symptom of this condition is pain. It may be a throbbing pain that gets worse during bowel movements.  Treatment for an anorectal abscess may include surgery to drain the pus from the abscess. Medicines and sitz baths may also be a part of your treatment plan. This information is not intended to replace advice given to you by your health care provider. Make sure you discuss any questions you have with your health care provider. Document Revised: 06/23/2017 Document Reviewed: 06/23/2017 Elsevier Patient Education  2021 [employer].

## 2020-06-24 ENCOUNTER — Other Ambulatory Visit: Payer: Self-pay | Admitting: Medical

## 2020-06-24 MED ORDER — METFORMIN HCL 850 MG PO TABS: 850.0000 mg | ORAL_TABLET | Freq: Two times a day (BID) | ORAL | 1 refills | Status: DC

## 2020-06-24 MED ORDER — GLIPIZIDE 5 MG PO TABS: 5.0000 mg | ORAL_TABLET | Freq: Two times a day (BID) | ORAL | 1 refills | Status: DC

## 2021-01-07 ENCOUNTER — Encounter: Payer: Self-pay | Admitting: Internal Medicine

## 2021-01-21 ENCOUNTER — Ambulatory Visit: Payer: Commercial Managed Care - PPO | Admitting: Medical

## 2021-01-28 ENCOUNTER — Other Ambulatory Visit: Payer: Self-pay

## 2021-01-28 ENCOUNTER — Ambulatory Visit (INDEPENDENT_AMBULATORY_CARE_PROVIDER_SITE_OTHER): Payer: BC Managed Care – PPO | Admitting: Medical

## 2021-01-28 VITALS — BP 130/52 | HR 74 | Wt 206.2 lb

## 2021-01-28 DIAGNOSIS — E118 Type 2 diabetes mellitus with unspecified complications: Secondary | ICD-10-CM | POA: Diagnosis not present

## 2021-01-28 DIAGNOSIS — R0789 Other chest pain: Secondary | ICD-10-CM

## 2021-01-28 DIAGNOSIS — Z9119 Patient's noncompliance with other medical treatment and regimen: Secondary | ICD-10-CM

## 2021-01-28 DIAGNOSIS — Z87891 Personal history of nicotine dependence: Secondary | ICD-10-CM

## 2021-01-28 DIAGNOSIS — Z91199 Patient's noncompliance with other medical treatment and regimen due to unspecified reason: Secondary | ICD-10-CM

## 2021-01-28 DIAGNOSIS — F1010 Alcohol abuse, uncomplicated: Secondary | ICD-10-CM

## 2021-01-28 MED ORDER — DICLOFENAC SODIUM 75 MG PO TBEC: 75.0000 mg | DELAYED_RELEASE_TABLET | Freq: Two times a day (BID) | ORAL | 0 refills | Status: DC

## 2021-01-28 NOTE — Patient Instructions (Signed)
Please go to [facility] for your chest and rib xray.   Their hours are 8am - 4:30 pm Monday - Friday.  Take your insurance card with you.  [facility] [phone]  [address], [place], [place] [address]  [address] [place], [place] [address]

## 2021-01-28 NOTE — Progress Notes (Signed)
Subjective:  Daniel Stevens is a 60 y.o. male who presents for Chief Complaint  Patient presents with   side pain    Side pain on left side at rib cage. Can't lay on that side. Been going on a couple weeks     Here for several week hx/o pain in right side/rib cage.  No injury, no fall, no trauma, no rash.   No cough, no wheezing, no SOB.  No belly pain. No blood in urine and no blood in stool.  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 No strenuous activity.   No fever, no night sweats, no appetite changes. No bruising.     Wants to get 2nd shingles vaccine soon.  Got last one months ago at [facility] flu shot today  Glucose been running 140s.    No other aggravating or relieving factors.    No other c/o.  Past Medical History:  Diagnosis Date   Diabetes mellitus without complication ([place]) [id number]   Family history of malignant hyperthermia    pt father passed away from malignant hyperthermia   Former smoker    quit 2016. smoked on and off for years, would quit and start back.  5 pack year hx/o as of 02/2016   Gout    History of alcohol abuse    Hx of adenomatous polyp of colon 01/21/2020   Hyperlipidemia 02/2016   Obesity    Sleep apnea 2020   12/25/19-not using cpap yet   Substance abuse ([place])    Current Outpatient Medications on File Prior to Visit  Medication Sig Dispense Refill   metFORMIN (GLUCOPHAGE) 850 MG tablet Take 1 tablet (850 mg total) by mouth 2 (two) times daily with a meal. 60 tablet 1   rosuvastatin (CRESTOR) 10 MG tablet Take 1 tablet (10 mg total) by mouth daily. (Patient not taking: No sig reported) 90 tablet 3   No current facility-administered medications on file prior to visit.   Family History  Problem Relation Age of Onset   Diabetes Mother    Arthritis Mother    Hypertension Mother    Cancer Mother        ovarian   Other Father        died due to medication error/anesthesia   Asthma Brother    Asthma Brother     Diabetes Brother    Stroke Neg Hx    Heart disease Neg Hx    Rectal cancer Neg Hx    Stomach cancer Neg Hx    Esophageal cancer Neg Hx    Colon cancer Neg Hx    Colon polyps Neg Hx      The following portions of the patient's history were reviewed and updated as appropriate: allergies, current medications, past family history, past medical history, past social history, past surgical history and problem list.  ROS Otherwise as in subjective above     Objective: BP (!) 130/52   Pulse 74   Wt 206 lb 3.2 oz (93.5 kg)   BMI 29.59 kg/m   General appearance: alert, no distress, well developed, well nourished Neck: supple, no lymphadenopathy, no thyromegaly, no masses Heart: RRR, normal S1, S2, no murmurs Lungs: CTA bilaterally, no wheezes, rhonchi, or rales Abdomen: +bs, soft, non tender, non distended, no masses, no hepatomegaly, no splenomegaly Mild tenderness over the right chest wall laterally in several places, similarly tender over the left lateral lower chest wall over the ribs, no other tenderness, no  obvious rash or bruising Pulses: 2+ radial pulses, 2+ pedal pulses, normal cap refill Ext: no edema   Assessment: Encounter Diagnoses  Name Primary?   Chest wall pain Yes   Type 2 diabetes mellitus with complication, without long-term current use of insulin ([place])    Alcohol abuse    Former smoker    Noncompliance      Plan: Rib pain with no apparent cause.  No obvious shingles rash.  Symptoms do not suggest shingles per se.  I will send for x-rays.  We discussed the fact that he is not doing a good job of coming in for routine follow-up for chronic issues including diabetes.  I advise he come in soon for fasting physical and updated labs including prostate cancer screening and likely abdominal imaging as well given prior heavier alcohol use and screening for liver cancer.   [name] was seen today for side pain.  Diagnoses and all orders for this visit:  Chest  wall pain -     DG Chest 2 View; Future -     DG Ribs Unilateral Right; Future  Type 2 diabetes mellitus with complication, without long-term current use of insulin (HCC)  Alcohol abuse -     DG Chest 2 View; Future -     DG Ribs Unilateral Right; Future  Former smoker -     DG Chest 2 View; Future -     DG Ribs Unilateral Right; Future  Noncompliance  Other orders -     diclofenac (VOLTAREN) 75 MG EC tablet; Take 1 tablet (75 mg total) by mouth 2 (two) times daily.   Follow up: pending xray, soon for fasting physical

## 2021-04-01 ENCOUNTER — Encounter: Payer: Self-pay | Admitting: Family Medicine

## 2021-04-01 ENCOUNTER — Telehealth (INDEPENDENT_AMBULATORY_CARE_PROVIDER_SITE_OTHER): Payer: BC Managed Care – PPO | Admitting: Family Medicine

## 2021-04-01 ENCOUNTER — Other Ambulatory Visit: Payer: Self-pay

## 2021-04-01 VITALS — Temp 98.7°F | Wt 202.0 lb

## 2021-04-01 DIAGNOSIS — J209 Acute bronchitis, unspecified: Secondary | ICD-10-CM | POA: Diagnosis not present

## 2021-04-01 MED ORDER — AMOXICILLIN 875 MG PO TABS: 875.0000 mg | ORAL_TABLET | Freq: Two times a day (BID) | ORAL | 0 refills | Status: DC

## 2021-04-01 NOTE — Progress Notes (Signed)
   Subjective:    Patient ID: Daniel Stevens, male    DOB: 04-24-1961, 60 y.o.   MRN: [id number]  HPI Documentation for virtual audio and video telecommunications through [place] encounter: The patient was located at home. 2 patient identifiers used.  The provider was located in the office. The patient did consent to this visit and is aware of possible charges through their insurance for this visit. The other persons participating in this telemedicine service were none. Time spent on call was 5 minutes and in review of previous records >15 minutes total for counseling and coordination of care. This virtual service is not related to other E/M service within previous 7 days.  He states that about 10 days ago he developed sinus congestion followed by chest congestion and cough that has now become productive.  He also is having a slight sore throat but no fever, chills.  He did COVID test which was negative.  He does not smoke.  Review of Systems     Objective:   Physical Exam Alert and nasal sounding with coughing and looking fatigued       Assessment & Plan:  Acute bronchitis, unspecified organism - Plan: amoxicillin (AMOXIL) 875 MG tablet Recommend Robitussin-DM during the day and NyQuil at night.  He is to call if no better in 10 days.

## 2021-04-03 ENCOUNTER — Telehealth: Payer: Self-pay | Admitting: Medical

## 2021-04-03 NOTE — Telephone Encounter (Signed)
Pt had a virtual with JCL on 11/2 and was prescribed antibiotics. He wants to see if he could get something for the sinus pressure and pain he is having also.

## 2021-04-03 NOTE — Telephone Encounter (Signed)
Pt advised and he said thank you!

## 2021-04-13 ENCOUNTER — Ambulatory Visit (INDEPENDENT_AMBULATORY_CARE_PROVIDER_SITE_OTHER): Payer: BC Managed Care – PPO | Admitting: Medical

## 2021-04-13 ENCOUNTER — Encounter: Payer: Self-pay | Admitting: Medical

## 2021-04-13 ENCOUNTER — Other Ambulatory Visit: Payer: Self-pay

## 2021-04-13 ENCOUNTER — Ambulatory Visit
Admission: RE | Admit: 2021-04-13 | Discharge: 2021-04-13 | Disposition: A | Discharge status: Home / Self Care | Payer: BC Managed Care – PPO | Source: Ambulatory Visit | Attending: Medical | Admitting: Medical

## 2021-04-13 VITALS — BP 130/88 | HR 77 | Temp 97.9°F | Wt 202.2 lb

## 2021-04-13 DIAGNOSIS — E118 Type 2 diabetes mellitus with unspecified complications: Secondary | ICD-10-CM | POA: Diagnosis not present

## 2021-04-13 DIAGNOSIS — Z87891 Personal history of nicotine dependence: Secondary | ICD-10-CM

## 2021-04-13 DIAGNOSIS — R079 Chest pain, unspecified: Secondary | ICD-10-CM

## 2021-04-13 DIAGNOSIS — M25551 Pain in right hip: Secondary | ICD-10-CM

## 2021-04-13 DIAGNOSIS — R0781 Pleurodynia: Secondary | ICD-10-CM

## 2021-04-13 DIAGNOSIS — M1A9XX0 Chronic gout, unspecified, without tophus (tophi): Secondary | ICD-10-CM

## 2021-04-13 DIAGNOSIS — Z91199 Patient's noncompliance with other medical treatment and regimen due to unspecified reason: Secondary | ICD-10-CM

## 2021-04-13 DIAGNOSIS — E785 Hyperlipidemia, unspecified: Secondary | ICD-10-CM

## 2021-04-13 DIAGNOSIS — Z125 Encounter for screening for malignant neoplasm of prostate: Secondary | ICD-10-CM | POA: Diagnosis not present

## 2021-04-13 MED ORDER — HYDROCODONE-ACETAMINOPHEN 5-325 MG PO TABS: 1.0000 | ORAL_TABLET | Freq: Four times a day (QID) | ORAL | 0 refills | Status: DC | PRN

## 2021-04-13 NOTE — Progress Notes (Signed)
Subjective:  Daniel Stevens is a 60 y.o. male who presents for Chief Complaint  Patient presents with   right side pain    Right side pain near rib pain, bending over helps some     Here for flare up of pains in right rib region started a week ago.  Steady pain last 3 days, feels better bending over.    Also has sharp pain through right hip region, sciatica acting up all weekend. .  No recent fall, no injury, no trauma.   No numbness or tingling in arms or legs, but sometimes left arm can seem a bit weaker than right.  No dropping items.  Right handed.  No fever, no nausea, no vomiting, no appetite changes, no diarrhea or constipation, no blood in urine, no problems with urination.  No belly pain, no back pain.   Has pain in right buttock and side.   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    Diabetes-last visit he was advised to come back for routine follow-up or physical.  This is his first time come back for acute pain but he is ready for some labs today.  He does report compliance of medication.  No polydipsia, no polyuria, no foot lesions  Hyperlipidemia-not compliant with medication  No other aggravating or relieving factors.    No other c/o.  Past Medical History:  Diagnosis Date   Diabetes mellitus without complication ([place]) 76/1607   Family history of malignant hyperthermia    pt father passed away from malignant hyperthermia   Former smoker    quit 2016. smoked on and off for years, would quit and start back.  5 pack year hx/o as of 02/2016   Gout    History of alcohol abuse    Hx of adenomatous polyp of colon 01/21/2020   Hyperlipidemia 02/2016   Obesity    Sleep apnea 2020   12/25/19-not using cpap yet   Substance abuse [facility])    Past Surgical History:  Procedure Laterality Date   COLONOSCOPY     pending 07/7104   UMBILICAL HERNIA REPAIR     remote past   Family History  Problem Relation Age of Onset   Diabetes Mother    Arthritis  Mother    Hypertension Mother    Cancer Mother        ovarian   Other Father        died due to medication error/anesthesia   Asthma Brother    Asthma Brother    Diabetes Brother    Stroke Neg Hx    Heart disease Neg Hx    Rectal cancer Neg Hx    Stomach cancer Neg Hx    Esophageal cancer Neg Hx    Colon cancer Neg Hx    Colon polyps Neg Hx      The following portions of the patient's history were reviewed and updated as appropriate: allergies, current medications, past family history, past medical history, past social history, past surgical history and problem list.  ROS Otherwise as in subjective above  Objective: BP 130/88   Pulse 77   Temp 97.9 F (36.6 C)   Wt 202 lb 3.2 oz (91.7 kg)   BMI 29.01 kg/m   Wt Readings from Last 3 Encounters:  04/13/21 202 lb 3.2 oz (91.7 kg)  04/01/21 202 lb (91.6 kg)  01/28/21 206 lb 3.2 oz (93.5 kg)   General appearance: alert, no distress, well  developed, well nourished Neck: supple, no lymphadenopathy, no thyromegaly, no masses Heart: RRR, normal S1, S2, no murmurs Lungs: CTA bilaterally, no wheezes, rhonchi, or rales Tender over right lateral and somewhat anterior lateral mid chest/ribs, no bruising or rash Abdomen: +bs, soft, non tender, non distended, no masses, no hepatomegaly, no splenomegaly Back: Mild tenderness of the right posterior ribs otherwise back nontender and relatively normal range of motion, flexion and extension about 90% of normal Right hip with mild pain with range of motion externally, but otherwise no tenderness to palpation no deformity, no trochanteric tenderness, left leg nontender, arms nontender Neuro: Arms and legs normal strength sensation and DTRs Pulses: 2+ radial pulses, 2+ pedal pulses, normal cap refill Ext: no edema   Assessment: Encounter Diagnoses  Name Primary?   Rib pain on right side Yes   Chest pain, unspecified type    Former smoker    Right hip pain    Type 2 diabetes mellitus  with complication, without long-term current use of insulin ([place])    Screening for prostate cancer    Hyperlipidemia, unspecified hyperlipidemia type    Noncompliance    Chronic gout without tophus, unspecified cause, unspecified site      Plan: He has not been great with compliance on chronic issues.  He is fasting today so we took the opportunity today to update some labs and screenings.  We discussed his unusual rib pain.  X-rays as listed today to further evaluate rib pain without trauma or injury, and hip pain given his hip symptoms.  Discussed possible differential.  Diabetes-updated labs today, continue current medication  Hyperlipidemia-updated labs today, needs to get back on medication  Former smoker-chest x-ray today  Chronic gout-no recent complaint  Daniel Stevens was seen today for right side pain.  Diagnoses and all orders for this visit:  Rib pain on right side -     DG Chest 2 View; Future -     DG Ribs Unilateral Right; Future -     CBC with Differential/Platelet  Chest pain, unspecified type -     DG Chest 2 View; Future -     DG Ribs Unilateral Right; Future -     CBC with Differential/Platelet  Former smoker -     DG Chest 2 View; Future -     DG Ribs Unilateral Right; Future -     CBC with Differential/Platelet  Right hip pain -     DG HIP UNILAT WITH PELVIS 2-3 VIEWS RIGHT; Future  Type 2 diabetes mellitus with complication, without long-term current use of insulin ([facility]) -     Hemoglobin A1c -     Comprehensive metabolic panel  Screening for prostate cancer -     PSA  Hyperlipidemia, unspecified hyperlipidemia type -     CBC with Differential/Platelet -     Lipid panel  Noncompliance  Chronic gout without tophus, unspecified cause, unspecified site   Follow up: pending xrays and labs

## 2021-04-13 NOTE — Progress Notes (Signed)
Done

## 2021-04-13 NOTE — Addendum Note (Signed)
Addended by: Carlena Hurl on: 04/13/2021 04:59 PM   Modules accepted: Orders

## 2021-04-13 NOTE — Patient Instructions (Signed)
Please go to [facility] for your hip chest and rib xrays.   Their hours are 8am - 4:30 pm Monday - Friday.  Take your insurance card with you.  [facility] [phone]  [address], [place], [place] [address]  [address] [place], [place] [address]

## 2021-04-14 ENCOUNTER — Other Ambulatory Visit: Payer: Self-pay | Admitting: Medical

## 2021-04-14 LAB — COMPREHENSIVE METABOLIC PANEL
ALT: 21 IU/L (ref 0–44)
AST: 11 IU/L (ref 0–40)
Albumin/Globulin Ratio: 1.6 (ref 1.2–2.2)
Albumin: 4.2 g/dL (ref 3.8–4.8)
Alkaline Phosphatase: 64 IU/L (ref 44–121)
BUN/Creatinine Ratio: 15 (ref 10–24)
BUN: 15 mg/dL (ref 8–27)
Bilirubin Total: <0.2 mg/dL (ref 0.0–1.2)
CO2: 24 mmol/L (ref 20–29)
Calcium: 9.3 mg/dL (ref 8.6–10.2)
Chloride: 102 mmol/L (ref 96–106)
Creatinine, Ser: 1.03 mg/dL (ref 0.76–1.27)
Globulin, Total: 2.7 g/dL (ref 1.5–4.5)
Glucose: 243 mg/dL — ABNORMAL HIGH (ref 70–99)
Potassium: 4.1 mmol/L (ref 3.5–5.2)
Sodium: 138 mmol/L (ref 134–144)
Total Protein: 6.9 g/dL (ref 6.0–8.5)
eGFR: 82 mL/min/{1.73_m2} (ref >59)

## 2021-04-14 LAB — CBC WITH DIFFERENTIAL/PLATELET
Basophils Absolute: 0 10*3/uL (ref 0.0–0.2)
Basos: 0 %
EOS (ABSOLUTE): 0.1 10*3/uL (ref 0.0–0.4)
Eos: 2 %
Hematocrit: 41.5 % (ref 37.5–51.0)
Hemoglobin: 13.6 g/dL (ref 13.0–17.7)
Immature Grans (Abs): 0 10*3/uL (ref 0.0–0.1)
Immature Granulocytes: 1 %
Lymphocytes Absolute: 2.5 10*3/uL (ref 0.7–3.1)
Lymphs: 37 %
MCH: 29 pg (ref 26.6–33.0)
MCHC: 32.8 g/dL (ref 31.5–35.7)
MCV: 89 fL (ref 79–97)
Monocytes Absolute: 0.4 10*3/uL (ref 0.1–0.9)
Monocytes: 6 %
Neutrophils Absolute: 3.7 10*3/uL (ref 1.4–7.0)
Neutrophils: 54 %
Platelets: 295 10*3/uL (ref 150–450)
RBC: 4.69 x10E6/uL (ref 4.14–5.80)
RDW: 13.5 % (ref 11.6–15.4)
WBC: 6.7 10*3/uL (ref 3.4–10.8)

## 2021-04-14 LAB — LIPID PANEL
Chol/HDL Ratio: 5.9 ratio — ABNORMAL HIGH (ref 0.0–5.0)
Cholesterol, Total: 230 mg/dL — ABNORMAL HIGH (ref 100–199)
HDL: 39 mg/dL — ABNORMAL LOW (ref >39)
LDL Chol Calc (NIH): 150 mg/dL — ABNORMAL HIGH (ref 0–99)
Triglycerides: 225 mg/dL — ABNORMAL HIGH (ref 0–149)
VLDL Cholesterol Cal: 41 mg/dL — ABNORMAL HIGH (ref 5–40)

## 2021-04-14 LAB — HEMOGLOBIN A1C
Est. average glucose Bld gHb Est-mCnc: 263 mg/dL
Hgb A1c MFr Bld: 10.8 % — ABNORMAL HIGH (ref 4.8–5.6)

## 2021-04-14 LAB — PSA: Prostate Specific Ag, Serum: 2.5 ng/mL (ref 0.0–4.0)

## 2021-04-14 MED ORDER — METFORMIN HCL 850 MG PO TABS
850.0000 mg | ORAL_TABLET | Freq: Two times a day (BID) | ORAL | 1 refills | Status: AC
Start: 2021-04-14 — End: 2022-04-14

## 2021-04-14 MED ORDER — ROSUVASTATIN CALCIUM 10 MG PO TABS: 10.0000 mg | ORAL_TABLET | Freq: Every day | ORAL | 3 refills | Status: AC

## 2021-04-14 MED ORDER — EMPAGLIFLOZIN 10 MG PO TABS: 10.0000 mg | ORAL_TABLET | Freq: Every day | ORAL | 0 refills | Status: DC

## 2021-04-15 ENCOUNTER — Other Ambulatory Visit: Payer: Self-pay | Admitting: Medical

## 2021-04-15 MED ORDER — DAPAGLIFLOZIN PROPANEDIOL 5 MG PO TABS: 5.0000 mg | ORAL_TABLET | Freq: Every day | ORAL | 2 refills | Status: AC

## 2021-05-26 ENCOUNTER — Telehealth: Payer: Self-pay | Admitting: Medical

## 2021-05-26 NOTE — Telephone Encounter (Signed)
Dismissal letter in guarantor snapshot  °

## 2021-06-02 ENCOUNTER — Telehealth: Payer: Self-pay | Admitting: Internal Medicine

## 2021-06-02 DIAGNOSIS — M25551 Pain in right hip: Secondary | ICD-10-CM

## 2021-06-02 NOTE — Telephone Encounter (Signed)
I am leaving for the day. Sending to [place] to handle

## 2021-06-02 NOTE — Telephone Encounter (Signed)
Patient notified, referral placed.

## 2021-06-02 NOTE — Telephone Encounter (Signed)
Pt called and states he is still having issue with his hip pain that he saw you back in November for. He would like to know if you will call something in.

## 2021-06-08 ENCOUNTER — Other Ambulatory Visit: Payer: Self-pay | Admitting: Medical

## 2021-06-08 ENCOUNTER — Telehealth: Payer: Self-pay | Admitting: Medical

## 2021-06-08 DIAGNOSIS — E118 Type 2 diabetes mellitus with unspecified complications: Secondary | ICD-10-CM

## 2021-06-08 MED ORDER — ACETAMINOPHEN-CODEINE #3 300-30 MG PO TABS: 1.0000 | ORAL_TABLET | ORAL | 0 refills | Status: AC | PRN

## 2021-06-08 NOTE — Telephone Encounter (Signed)
Pt called and stated that his ortho appt isn't until next week and wanted to see if he could get something called in for his hip pain. He stated that the Tylenol is not working. He uses the [facility] on [address]

## 2021-06-11 ENCOUNTER — Other Ambulatory Visit: Payer: Self-pay | Admitting: Medical

## 2021-06-30 DIAGNOSIS — M1611 Unilateral primary osteoarthritis, right hip: Secondary | ICD-10-CM | POA: Diagnosis not present

## 2021-06-30 DIAGNOSIS — M25551 Pain in right hip: Secondary | ICD-10-CM | POA: Diagnosis not present

## 2021-09-21 DIAGNOSIS — M1611 Unilateral primary osteoarthritis, right hip: Secondary | ICD-10-CM | POA: Diagnosis not present

## 2021-11-06 ENCOUNTER — Telehealth: Payer: BC Managed Care – PPO | Admitting: Physician Assistant

## 2021-11-06 ENCOUNTER — Ambulatory Visit: Payer: Self-pay | Admitting: *Deleted

## 2021-11-06 DIAGNOSIS — R591 Generalized enlarged lymph nodes: Secondary | ICD-10-CM | POA: Diagnosis not present

## 2021-11-06 MED ORDER — AMOXICILLIN 500 MG PO CAPS: 500.0000 mg | ORAL_CAPSULE | Freq: Two times a day (BID) | ORAL | 0 refills | Status: DC

## 2021-11-06 NOTE — Patient Instructions (Signed)
Despina Pole, thank you for joining Mar Daring, PA-C for today's virtual visit.  While this provider is not your primary care provider (PCP), if your PCP is located in our provider database this encounter information will be shared with them immediately following your visit.  Consent: (Patient) Daniel Stevens provided verbal consent for this virtual visit at the beginning of the encounter.  Current Medications:  Current Outpatient Medications:    amoxicillin (AMOXIL) 500 MG capsule, Take 1 capsule (500 mg total) by mouth 2 (two) times daily for 10 days., Disp: 20 capsule, Rfl: 0   acetaminophen-codeine (TYLENOL #3) 300-30 MG tablet, Take 1 tablet by mouth every 4 (four) hours as needed for moderate pain., Disp: 15 tablet, Rfl: 0   dapagliflozin propanediol (FARXIGA) 5 MG TABS tablet, Take 1 tablet (5 mg total) by mouth daily before breakfast., Disp: 30 tablet, Rfl: 2   metFORMIN (GLUCOPHAGE) 850 MG tablet, Take 1 tablet (850 mg total) by mouth 2 (two) times daily with a meal., Disp: 180 tablet, Rfl: 1   rosuvastatin (CRESTOR) 10 MG tablet, Take 1 tablet (10 mg total) by mouth daily., Disp: 90 tablet, Rfl: 3   Medications ordered in this encounter:  Meds ordered this encounter  Medications   amoxicillin (AMOXIL) 500 MG capsule    Sig: Take 1 capsule (500 mg total) by mouth 2 (two) times daily for 10 days.    Dispense:  20 capsule    Refill:  0    Order Specific Question:   Supervising Provider    Answer:   Sabra Heck, [place]     *If you need refills on other medications prior to your next appointment, please contact your pharmacy*  Follow-Up: Call back or seek an in-person evaluation if the symptoms worsen or if the condition fails to improve as anticipated.  Other Instructions Lymphadenopathy  Lymphadenopathy means that your lymph glands are swollen or larger than normal. Lymph glands, also called lymph nodes, are collections of tissue that filter excess fluid, bacteria,  viruses, and waste from your bloodstream. They are part of your body's disease-fighting system (immune system), which protects your body from germs. There may be different causes of lymphadenopathy, depending on where it is in your body. Some types go away on their own. Lymphadenopathy can occur anywhere that you have lymph glands, including these areas: Neck (cervical lymphadenopathy). Chest (mediastinal lymphadenopathy). Lungs (hilar lymphadenopathy). Underarms (axillary lymphadenopathy). Groin (inguinal lymphadenopathy). When your immune system responds to germs, infection-fighting cells and fluid build up in your lymph glands. This causes some swelling and enlargement. If the lymph nodes do not go back to normal size after you have an infection or disease, your health care provider may do tests. These tests help to monitor your condition and find the reason why the glands are still swollen and enlarged. Follow these instructions at home:  Get plenty of rest. Your health care provider may recommend over-the-counter medicines for pain. Take over-the-counter and prescription medicines only as told by your health care provider. If directed, apply heat to swollen lymph glands as often as told by your health care provider. Use the heat source that your health care provider recommends, such as a moist heat pack or a heating pad. Place a towel between your skin and the heat source. Leave the heat on for 20-30 minutes. Remove the heat if your skin turns bright red. This is especially important if you are unable to feel pain, heat, or cold. You may have a greater risk  of getting burned. Check your affected lymph glands every day for changes. Check other lymph gland areas as told by your health care provider. Check for changes such as: More swelling. Sudden increase in size. Redness or pain. Hardness. Keep all follow-up visits. This is important. Contact a health care provider if you have: Lymph  glands that: Are still swollen after 2 weeks. Have suddenly gotten bigger or the swelling spreads. Are red, painful, or hard. Fluid leaking from the skin near an enlarged lymph gland. Problems with breathing. A fever, chills, or night sweats. Fatigue. A sore throat. Pain in your abdomen. Weight loss. Get help right away if you have: Severe pain. Chest pain. Shortness of breath. These symptoms may represent a serious problem that is an emergency. Do not wait to see if the symptoms will go away. Get medical help right away. Call your local emergency services (911 in the [place] Do not drive yourself to the hospital. Summary Lymphadenopathy means that your lymph glands are swollen or larger than normal. Lymph glands, also called lymph nodes, are collections of tissue that filter excess fluid, bacteria, viruses, and waste from the bloodstream. They are part of your body's disease-fighting system (immune system). Lymphadenopathy can occur anywhere that you have lymph glands. If the lymph nodes do not go back to normal size after you have an infection or disease, your health care provider may do tests to monitor your condition and find the reason why the glands are still swollen and enlarged. Check your affected lymph glands every day for changes. Check other lymph gland areas as told by your health care provider. This information is not intended to replace advice given to you by your health care provider. Make sure you discuss any questions you have with your health care provider. Document Revised: 03/12/2020 Document Reviewed: 03/12/2020 Elsevier Patient Education  [place].  Salivary Stone  A salivary stone is a small cluster of mineral (mineral deposit) that builds up in the tubes (ducts) that drain the salivary glands. Most salivary stones are made of calcium. When a stone forms, saliva can back up into the gland and cause painful swelling. Your salivary glands are the glands  that make saliva. You have six major salivary glands. Each gland has a duct that carries saliva into your mouth. Saliva keeps your mouth moist and breaks down the food that you eat. It also helps prevent tooth decay. Two salivary glands are found just in front of your ears (parotid). The ducts for these glands open up inside your cheeks, near your back teeth. You also have two glands under your tongue (sublingual) and two glands under your jaw (submandibular). The ducts for these glands open under your tongue. A stone can form in any salivary gland. The most common place for a salivary stone to form is in a submandibular salivary gland. What are the causes? Salivary stones may be caused by any condition that lessens the flow of saliva. It is not known why some people get stones. What increases the risk? You are more likely to develop this condition if: You do not drink enough water. You smoke. You have any of these: High blood pressure. Gout. Diabetes. What are the signs or symptoms? The main sign of a salivary stone is sudden swelling of a salivary gland during eating. This usually happens under the jaw on one side. Other signs and symptoms may include: Swelling of the cheek or under the tongue during eating. Pain in the swollen area.  Trouble chewing or swallowing. Swelling that goes down after eating. Sometimes, the salivary stone may be seen. The stone is oval in shape and may be white or yellow in color. How is this diagnosed? This condition may be diagnosed based on: Your signs and symptoms. A physical exam. In many cases, your health care provider will be able to feel the stone in a duct inside your mouth. Imaging studies, such as: X-rays. Ultrasound. CT scan. MRI. You may need to see an ear, nose, and throat specialist (ENT or otolaryngologist) for diagnosis and treatment. How is this treated? Treatment for this condition depends on the size of the stone. A small stone that is  not causing symptoms may be treated with home care. A stone that is large enough to cause symptoms may be treated by: Probing and widening of the duct to let the stone pass. Putting a thin, flexible scope (endoscope) into the duct to find and remove the stone. Breaking up the stone with sound waves. Removing the whole salivary gland. Follow these instructions at home: To relieve discomfort Take NSAIDs, such as ibuprofen, to help relieve pain and swelling as told by your health care provider. Follow these instructions every few hours: Suck on a lemon candy or a vitamin C lozenge to prompt the flow of saliva. Put a warm, damp cloth (warmcompress) over the gland. Gently massage the gland. General instructions  Take over-the-counter and prescription medicines only as told by your health care provider. Drink enough fluid to keep your urine pale yellow. Do not use any products that contain nicotine or tobacco. These products include cigarettes, chewing tobacco, and vaping devices, such as e-cigarettes. If you need help quitting, ask your health care provider. Keep all follow-up visits. This is important. Contact a health care provider if: You have pain and swelling in your face, jaw, or mouth after eating. You keep having swelling in any of these places: In front of your ear. Under your jaw. Inside your mouth. Get help right away if: You have pain and swelling in your face, jaw, or mouth, that suddenly gets worse. Your pain and swelling make it hard to swallow, talk, or breathe. These symptoms may be an emergency. Get help right away. Call 911. Do not wait to see if the symptoms will go away. Do not drive yourself to the hospital. Summary A salivary stone is a small clump of mineral (mineral deposit) that builds up in the ducts that drain your salivary glands. When a stone forms, saliva can back up into the gland and cause painful swelling. Salivary stones may be caused by any condition  that lessens the flow of saliva. Treatment for this condition depends on the size of the stone. This information is not intended to replace advice given to you by your health care provider. Make sure you discuss any questions you have with your health care provider. Document Revised: 05/13/2021 Document Reviewed: 05/13/2021 Elsevier Patient Education  [place].     If you have been instructed to have an in-person evaluation today at a local Urgent Care facility, please use the link below. It will take you to a list of all of our available [facility], including address, phone number and hours of operation. Please do not delay care.  [facility]  If you or a family member do not have a primary care provider, use the link below to schedule a visit and establish care. When you choose a [facility]  primary care physician or advanced practice provider, you gain a long-term partner in health. Find a Primary Care Provider  Learn more about [facility] in-office and virtual care options: [facility]

## 2021-11-06 NOTE — Progress Notes (Signed)
Virtual Visit Consent   [name], you are scheduled for a virtual visit with a [facility] provider today. Just as with appointments in the office, your consent must be obtained to participate. Your consent will be active for this visit and any virtual visit you may have with one of our providers in the next 365 days. If you have a MyChart account, a copy of this consent can be sent to you electronically.  As this is a virtual visit, video technology does not allow for your provider to perform a traditional examination. This may limit your provider's ability to fully assess your condition. If your provider identifies any concerns that need to be evaluated in person or the need to arrange testing (such as labs, EKG, etc.), we will make arrangements to do so. Although advances in technology are sophisticated, we cannot ensure that it will always work on either your end or our end. If the connection with a video visit is poor, the visit may have to be switched to a telephone visit. With either a video or telephone visit, we are not always able to ensure that we have a secure connection.  By engaging in this virtual visit, you consent to the provision of healthcare and authorize for your insurance to be billed (if applicable) for the services provided during this visit. Depending on your insurance coverage, you may receive a charge related to this service.  I need to obtain your verbal consent now. Are you willing to proceed with your visit today? Jomarion Mish has provided verbal consent on 11/06/2021 for a virtual visit (video or telephone). Mar Daring, PA-C  Date: 11/06/2021 3:35 PM  Virtual Visit via Video Note   I, Mar Daring, connected with  Meric Joye  ([id number], [birth year]) on 11/06/21 at  3:00 PM EDT by a video-enabled telemedicine application and verified that I am speaking with the correct person using two identifiers.  Location: Patient: Virtual Visit Location Patient:  Mobile Provider: Virtual Visit Location Provider: Home Office   I discussed the limitations of evaluation and management by telemedicine and the availability of in person appointments. The patient expressed understanding and agreed to proceed.    History of Present Illness: Daniel Stevens is a 61 y.o. who identifies as a male who was assigned male at birth, and is being seen today for swelling of the neck. Swelling is located on the left side of the neck under the jaw line. He has had associated congestion and mild cough.  Symptoms started about a week ago, but has continued to increase in size and become tender to touch. Denies warmth, sore throat. Has tried ibuprofen '400mg'$  today with some improvements.    Problems:  Patient Active Problem List   Diagnosis Date Noted   Chest wall pain 01/28/2021   Noncompliance 01/28/2021   Perirectal cellulitis 06/23/2020   Pilonidal cyst 06/23/2020   Rectal pain 06/23/2020   Right leg pain 03/24/2020   Right hip pain 03/24/2020   Acute otitis media 03/24/2020   Dizziness 03/24/2020   Perforated ear drum, right 03/24/2020   Hx of adenomatous polyp of colon 01/21/2020   Encounter for health maintenance examination in adult 12/04/2019   Screening for prostate cancer 12/04/2019   Former smoker 12/04/2019   Vaccine counseling 12/04/2019   Need for Tdap vaccination 12/04/2019   Acute pain of left shoulder 12/04/2019   Acute pain of right shoulder 12/04/2019   Sleep disturbance 12/18/2018   Snoring 12/18/2018   BMI  29.0-29.9,adult 12/18/2018   Chronic bilateral low back pain 07/25/2018   Bilateral hip pain 07/25/2018   Chronic gout without tophus 07/25/2018   Alcohol abuse 07/25/2018   Type 2 diabetes mellitus with complication, without long-term current use of insulin ([place]) 01/18/2018   Hyperlipidemia 01/18/2018   Need for influenza vaccination 01/18/2018   Lipoma of anterior chest wall 03/09/2016    Allergies:  Allergies  Allergen Reactions    Promethazine     Groggy, excessive drowsiness   Medications:  Current Outpatient Medications:    amoxicillin (AMOXIL) 500 MG capsule, Take 1 capsule (500 mg total) by mouth 2 (two) times daily for 10 days., Disp: 20 capsule, Rfl: 0   acetaminophen-codeine (TYLENOL #3) 300-30 MG tablet, Take 1 tablet by mouth every 4 (four) hours as needed for moderate pain., Disp: 15 tablet, Rfl: 0   dapagliflozin propanediol (FARXIGA) 5 MG TABS tablet, Take 1 tablet (5 mg total) by mouth daily before breakfast., Disp: 30 tablet, Rfl: 2   metFORMIN (GLUCOPHAGE) 850 MG tablet, Take 1 tablet (850 mg total) by mouth 2 (two) times daily with a meal., Disp: 180 tablet, Rfl: 1   rosuvastatin (CRESTOR) 10 MG tablet, Take 1 tablet (10 mg total) by mouth daily., Disp: 90 tablet, Rfl: 3  Observations/Objective: Patient is well-developed, well-nourished in no acute distress.  Resting comfortably Head is normocephalic, atraumatic.  No labored breathing.  Speech is clear and coherent with logical content.  Patient is alert and oriented at baseline.  There is a small visible knot noted on the left side under the jawline  Assessment and Plan: 1. Lymphadenopathy - amoxicillin (AMOXIL) 500 MG capsule; Take 1 capsule (500 mg total) by mouth 2 (two) times daily for 10 days.  Dispense: 20 capsule; Refill: 0  - DDx: Lymphadenopathy vs sialadenitis/salivary stone - Will treat with Amoxil for possible reactive lymph node since it has become more swollen and tender to touch acutely over the last 2 days - Continue Ibuprofen - Warm compresses - Advised to try sour candies like warheads as well in case possible salivary stone - Seek in person evaluation if not improving after treatment or if worsening at any time  Follow Up Instructions: I discussed the assessment and treatment plan with the patient. The patient was provided an opportunity to ask questions and all were answered. The patient agreed with the plan and demonstrated  an understanding of the instructions.  A copy of instructions were sent to the patient via MyChart unless otherwise noted below.    The patient was advised to call back or seek an in-person evaluation if the symptoms worsen or if the condition fails to improve as anticipated.  Time:  I spent 12 minutes with the patient via telehealth technology discussing the above problems/concerns.    Mar Daring, PA-C

## 2021-11-06 NOTE — Telephone Encounter (Signed)
  Chief Complaint: Swelling Neck Symptoms: Swelling under jaw line, getting larger, tender to touch. 2 ins around. Stuffy Frequency: 1 week Pertinent Negatives: Patient denies fever, cough Disposition: '[]'$ ED /'[]'$ Urgent Care (no appt availability in office) / '[]'$ Appointment(In office/virtual)/ '[x]'$  [facility]/ '[]'$ Home Care/ '[]'$ Refused Recommended Disposition /'[]'$ [facility]/ '[]'$  Follow-up with PCP Additional Notes: Care advise provided. Reason for Disposition  [1] Mild face swelling (puffiness) AND [2] persists > 3 days  Answer Assessment - Initial Assessment Questions 1. ONSET: "When did the swelling start?" (e.g., minutes, hours, days)     1 week ago 2. LOCATION: "What part of the face is swollen?"     Under jaw line 3. SEVERITY: "How swollen is it?"     1-2 in around 4. ITCHING: "Is there any itching?" If Yes, ask: "How much?"   (Scale 1-10; mild, moderate or severe)     No 5. PAIN: "Is the swelling painful to touch?" If Yes, ask: "How painful is it?"   (Scale 1-10; mild, moderate or severe)   - NONE (0): no pain   - MILD (1-3): doesn't interfere with normal activities    - MODERATE (4-7): interferes with normal activities or awakens from sleep    - SEVERE (8-10): excruciating pain, unable to do any normal activities      Tender to touch 6. FEVER: "Do you have a fever?" If Yes, ask: "What is it, how was it measured, and when did it start?"      no 7. CAUSE: "What do you think is causing the face swelling?"     Unsure 8. RECURRENT SYMPTOM: "Have you had face swelling before?" If Yes, ask: "When was the last time?" "What happened that time?"     No 9. OTHER SYMPTOMS: "Do you have any other symptoms?" (e.g., toothache, leg swelling)     No  Protocols used: Face Swelling-A-AH

## 2021-11-10 ENCOUNTER — Encounter (HOSPITAL_COMMUNITY): Payer: Self-pay | Admitting: Emergency Medicine

## 2021-11-10 ENCOUNTER — Other Ambulatory Visit: Payer: Self-pay

## 2021-11-10 ENCOUNTER — Ambulatory Visit (HOSPITAL_COMMUNITY)
Admission: EM | Admit: 2021-11-10 | Discharge: 2021-11-10 | Disposition: A | Discharge status: Home / Self Care | Payer: BC Managed Care – PPO | Attending: Physician Assistant | Admitting: Physician Assistant

## 2021-11-10 DIAGNOSIS — I889 Nonspecific lymphadenitis, unspecified: Secondary | ICD-10-CM

## 2021-11-10 LAB — COMPREHENSIVE METABOLIC PANEL
ALT: 15 U/L (ref 0–44)
AST: 13 U/L — ABNORMAL LOW (ref 15–41)
Albumin: 4 g/dL (ref 3.5–5.0)
Alkaline Phosphatase: 51 U/L (ref 38–126)
Anion gap: 9 (ref 5–15)
BUN: 18 mg/dL (ref 8–23)
CO2: 24 mmol/L (ref 22–32)
Calcium: 9.4 mg/dL (ref 8.9–10.3)
Chloride: 106 mmol/L (ref 98–111)
Creatinine, Ser: 0.82 mg/dL (ref 0.61–1.24)
GFR, Estimated: >60 mL/min (ref >60)
Glucose, Bld: 205 mg/dL — ABNORMAL HIGH (ref 70–99)
Potassium: 4 mmol/L (ref 3.5–5.1)
Sodium: 139 mmol/L (ref 135–145)
Total Bilirubin: 0.5 mg/dL (ref 0.3–1.2)
Total Protein: 7.1 g/dL (ref 6.5–8.1)

## 2021-11-10 LAB — CBC WITH DIFFERENTIAL/PLATELET
Abs Immature Granulocytes: 0.05 10*3/uL (ref 0.00–0.07)
Basophils Absolute: 0 10*3/uL (ref 0.0–0.1)
Basophils Relative: 1 %
Eosinophils Absolute: 0.2 10*3/uL (ref 0.0–0.5)
Eosinophils Relative: 2 %
HCT: 42.9 % (ref 39.0–52.0)
Hemoglobin: 14 g/dL (ref 13.0–17.0)
Immature Granulocytes: 1 %
Lymphocytes Relative: 37 %
Lymphs Abs: 3 10*3/uL (ref 0.7–4.0)
MCH: 29.9 pg (ref 26.0–34.0)
MCHC: 32.6 g/dL (ref 30.0–36.0)
MCV: 91.5 fL (ref 80.0–100.0)
Monocytes Absolute: 0.4 10*3/uL (ref 0.1–1.0)
Monocytes Relative: 5 %
Neutro Abs: 4.4 10*3/uL (ref 1.7–7.7)
Neutrophils Relative %: 54 %
Platelets: 205 10*3/uL (ref 150–400)
RBC: 4.69 MIL/uL (ref 4.22–5.81)
RDW: 14.2 % (ref 11.5–15.5)
WBC: 8.1 10*3/uL (ref 4.0–10.5)
nRBC: 0 % (ref 0.0–0.2)

## 2021-11-10 LAB — POCT INFECTIOUS MONO SCREEN, ED / UC: Mono Screen: NEGATIVE

## 2021-11-10 LAB — HIV ANTIBODY (ROUTINE TESTING W REFLEX): HIV Screen 4th Generation wRfx: NONREACTIVE

## 2021-11-10 MED ORDER — CEFTRIAXONE SODIUM 1 G IJ SOLR
1.0000 g | Freq: Once | INTRAMUSCULAR | Status: AC
  Administered 2021-11-10: 1 g via INTRAMUSCULAR

## 2021-11-10 MED ORDER — CEFTRIAXONE SODIUM 1 G IJ SOLR
INTRAMUSCULAR | Status: AC
  Filled 2021-11-10: qty 10

## 2021-11-10 MED ORDER — LIDOCAINE HCL (PF) 1 % IJ SOLN
INTRAMUSCULAR | Status: AC
  Filled 2021-11-10: qty 2

## 2021-11-10 MED ORDER — AMOXICILLIN-POT CLAVULANATE 875-125 MG PO TABS: 1.0000 | ORAL_TABLET | Freq: Two times a day (BID) | ORAL | 0 refills | Status: AC

## 2021-11-10 MED ORDER — NAPROXEN 500 MG PO TABS: 500.0000 mg | ORAL_TABLET | Freq: Two times a day (BID) | ORAL | 0 refills | Status: AC

## 2021-11-10 NOTE — ED Provider Notes (Signed)
[place]    CSN: [id number] Arrival date & time: 11/10/21  1522      History   Chief Complaint Chief Complaint  Patient presents with   Jaw Pain    HPI Daniel Stevens is a 61 y.o. male.   Patient presents today with a 1 week history of enlarging submandibular lymphadenopathy.  Reports that he was seen approximately 48 hours after symptoms began and a virtual visit at which point he was prescribed amoxicillin 500 mg twice daily which he has been taking as prescribed without improvement of symptoms.  Reports lesion has enlarged and become more painful.  Denies any significant sore throat but does have pain with swallowing related to lesion.  Pain is rated 7 on a 0-10 pain scale, described as sharp, worse with palpation or movement, no alleviating factors identified.  He denies history of HIV or immunosuppression.  Denies episodes of similar symptoms.  He denies history of autoimmune condition.  He denies any new exposures including to chemicals or animals.  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  Denies additional antibiotics in the past 90 days.  Denies any known sick contacts.  He denies any night sweats, weight loss, fever, nausea/vomiting, cough, arthralgias, weakness.    Past Medical History:  Diagnosis Date   Diabetes mellitus without complication ([place]) 40/0867   Family history of malignant hyperthermia    pt father passed away from malignant hyperthermia   Former smoker    quit 2016. smoked on and off for years, would quit and start back.  5 pack year hx/o as of 02/2016   Gout    History of alcohol abuse    Hx of adenomatous polyp of colon 01/21/2020   Hyperlipidemia 02/2016   Obesity    Sleep apnea 2020   12/25/19-not using cpap yet   Substance abuse [facility])     Patient Active Problem List   Diagnosis Date Noted   Chest wall pain 01/28/2021   Noncompliance 01/28/2021   Perirectal cellulitis 06/23/2020   Pilonidal  cyst 06/23/2020   Rectal pain 06/23/2020   Right leg pain 03/24/2020   Right hip pain 03/24/2020   Acute otitis media 03/24/2020   Dizziness 03/24/2020   Perforated ear drum, right 03/24/2020   Hx of adenomatous polyp of colon 01/21/2020   Encounter for health maintenance examination in adult 12/04/2019   Screening for prostate cancer 12/04/2019   Former smoker 12/04/2019   Vaccine counseling 12/04/2019   Need for Tdap vaccination 12/04/2019   Acute pain of left shoulder 12/04/2019   Acute pain of right shoulder 12/04/2019   Sleep disturbance 12/18/2018   Snoring 12/18/2018   BMI 29.0-29.9,adult 12/18/2018   Chronic bilateral low back pain 07/25/2018   Bilateral hip pain 07/25/2018   Chronic gout without tophus 07/25/2018   Alcohol abuse 07/25/2018   Type 2 diabetes mellitus with complication, without long-term current use of insulin ([place]) 01/18/2018   Hyperlipidemia 01/18/2018   Need for influenza vaccination 01/18/2018   Lipoma of anterior chest wall 03/09/2016    Past Surgical History:  Procedure Laterality Date   COLONOSCOPY     pending 10/1948   UMBILICAL HERNIA REPAIR     remote past       Home Medications    Prior to Admission medications   Medication Sig Start Date End Date Taking? Authorizing Provider  amoxicillin-clavulanate (AUGMENTIN) 875-125 MG tablet Take 1 tablet by mouth every 12 (twelve) hours. 11/10/21  Yes Raschelle Wisenbaker K, PA-C  naproxen (NAPROSYN) 500 MG tablet Take 1 tablet (500 mg total) by mouth 2 (two) times daily with a meal. 11/10/21  Yes Hutch Rhett K, PA-C  acetaminophen-codeine (TYLENOL #3) 300-30 MG tablet Take 1 tablet by mouth every 4 (four) hours as needed for moderate pain. 06/08/21   Tysinger, Camelia Eng, PA-C  dapagliflozin propanediol (FARXIGA) 5 MG TABS tablet Take 1 tablet (5 mg total) by mouth daily before breakfast. 04/15/21   Tysinger, Camelia Eng, PA-C  metFORMIN (GLUCOPHAGE) 850 MG tablet Take 1 tablet (850 mg total) by mouth 2 (two)  times daily with a meal. 04/14/21 04/14/22  Tysinger, Camelia Eng, PA-C  rosuvastatin (CRESTOR) 10 MG tablet Take 1 tablet (10 mg total) by mouth daily. Patient not taking: Reported on 11/10/2021 04/14/21 04/14/22  Tysinger, Camelia Eng, PA-C    Family History Family History  Problem Relation Age of Onset   Diabetes Mother    Arthritis Mother    Hypertension Mother    Cancer Mother        ovarian   Other Father        died due to medication error/anesthesia   Asthma Brother    Asthma Brother    Diabetes Brother    Stroke Neg Hx    Heart disease Neg Hx    Rectal cancer Neg Hx    Stomach cancer Neg Hx    Esophageal cancer Neg Hx    Colon cancer Neg Hx    Colon polyps Neg Hx     Social History Social History   Tobacco Use   Smoking status: Former    Packs/day: 0.00    Years: 0.00    Total pack years: 0.00    Types: Cigarettes    Quit date: 06/22/2013    Years since quitting: 8.3   Smokeless tobacco: Never  Vaping Use   Vaping Use: Never used  Substance Use Topics   Alcohol use: Not Currently    Comment: quit in august 2019   Drug use: No     Allergies   Promethazine   Review of Systems Review of Systems  Constitutional:  Positive for activity change. Negative for appetite change, chills, fatigue, fever and unexpected weight change.  HENT:  Positive for facial swelling and trouble swallowing. Negative for congestion, sinus pressure, sneezing, sore throat and voice change.   Respiratory:  Negative for cough and shortness of breath.   Cardiovascular:  Negative for chest pain.  Gastrointestinal:  Negative for abdominal pain, diarrhea, nausea and vomiting.  Neurological:  Negative for dizziness, light-headedness and headaches.     Physical Exam Triage Vital Signs ED Triage Vitals  Enc Vitals Group     BP 11/10/21 1549 (!) 158/92     Pulse Rate 11/10/21 1549 78     Resp 11/10/21 1549 20     Temp 11/10/21 1549 98.2 F (36.8 C)     Temp Source 11/10/21 1549 Oral      SpO2 11/10/21 1549 96 %     Weight --      Height --      Head Circumference --      Peak Flow --      Pain Score 11/10/21 1547 7     Pain Loc --      Pain Edu? --      Excl. in [place]? --    No data found.  Updated Vital Signs BP (!) 158/92 (BP Location: Left Arm) Comment (BP Location):  large cuff  Pulse 78   Temp 98.2 F (36.8 C) (Oral)   Resp 20   SpO2 96%   Visual Acuity Right Eye Distance:   Left Eye Distance:   Bilateral Distance:    Right Eye Near:   Left Eye Near:    Bilateral Near:     Physical Exam Vitals reviewed.  Constitutional:      General: He is awake.     Appearance: Normal appearance. He is well-developed. He is not ill-appearing.     Comments: Very pleasant male appears stated age in no acute distress sitting comfortably in exam room  HENT:     Head: Normocephalic and atraumatic.     Right Ear: Tympanic membrane, ear canal and external ear normal. Tympanic membrane is not erythematous or bulging.     Left Ear: Tympanic membrane, ear canal and external ear normal. Tympanic membrane is not erythematous or bulging.     Nose: Nose normal.     Mouth/Throat:     Pharynx: Uvula midline. No oropharyngeal exudate, posterior oropharyngeal erythema or uvula swelling.  Cardiovascular:     Rate and Rhythm: Normal rate and regular rhythm.     Heart sounds: Normal heart sounds, S1 normal and S2 normal. No murmur heard. Pulmonary:     Effort: Pulmonary effort is normal. No accessory muscle usage or respiratory distress.     Breath sounds: Normal breath sounds. No stridor. No wheezing, rhonchi or rales.     Comments: Clear to auscultation bilaterally Abdominal:     General: Bowel sounds are normal.     Palpations: Abdomen is soft.     Tenderness: There is no abdominal tenderness.  Lymphadenopathy:     Head:     Right side of head: No submental, submandibular or tonsillar adenopathy.     Left side of head: Submandibular adenopathy present. No submental or  tonsillar adenopathy.     Cervical: No cervical adenopathy.     Right cervical: No superficial or deep cervical adenopathy.    Left cervical: No superficial or deep cervical adenopathy.     Upper Body:     Right upper body: No supraclavicular adenopathy.     Left upper body: No supraclavicular adenopathy.  Neurological:     Mental Status: He is alert.  Psychiatric:        Behavior: Behavior is cooperative.      UC Treatments / Results  Labs (all labs ordered are listed, but only abnormal results are displayed) Labs Reviewed  CBC WITH DIFFERENTIAL/PLATELET  COMPREHENSIVE METABOLIC PANEL  HIV ANTIBODY (ROUTINE TESTING W REFLEX)  POCT INFECTIOUS MONO SCREEN, ED / UC    EKG   Radiology No results found.  Procedures Procedures (including critical care time)  Medications Ordered in UC Medications  cefTRIAXone (ROCEPHIN) injection 1 g (1 g Intramuscular Given 11/10/21 1628)    Initial Impression / Assessment and Plan / UC Course  I have reviewed the triage vital signs and the nursing notes.  Pertinent labs & imaging results that were available during my care of the patient were reviewed by me and considered in my medical decision making (see chart for details).     Patient is afebrile, nontoxic, nontachycardic.  Normal-appearing oropharynx with no respiratory distress on exam.  Discussed concern for lymphadenitis.  Monotest was negative.  CBC, CMP, HIV testing are pending.  Has been taking amoxicillin without improvement of symptoms we will discontinue this and start Augmentin in the hopes of better coverage.  He  was given 1 g of Rocephin in clinic today.  He was prescribed Naprosyn twice daily for pain.  He was instructed to avoid additional NSAIDs with this medication due to risk of GI bleeding.  Can use Tylenol for breakthrough pain.  Discussed that given enlarging lesion despite antibiotics it is likely that he will need imaging which we cannot arrange in urgent care.   Recommended that he follow-up with ENT as soon as possible and was given contact information for local provider with instruction to call to schedule an appointment as soon as possible.  Discussed that if he has any worsening symptoms he would need to go to the emergency room where imaging can be arranged.  He was instructed to go to the ER with any fever, enlarging lesion, shortness of breath, difficulty swallowing, nausea/vomiting. Strict return precautions given to which he expressed understanding.  Final Clinical Impressions(s) / UC Diagnoses   Final diagnoses:  Submandibular lymphadenitis     Discharge Instructions      It appears you have a swollen lymph node.  I will contact you with your lab work if anything is abnormal.  We gave you an antibiotic of antibiotics and I want you to stop amoxicillin and start Augmentin (amoxicillin/clavulanic acid).  It is very important that you follow-up with ENT as soon as possible.  Please call to schedule appointment them first thing tomorrow.  If you have any worsening symptoms including enlarging lesion, difficulty swallowing, shortness of breath, fever, nausea/vomiting you need to go to the emergency room as we discussed.     ED Prescriptions     Medication Sig Dispense Auth. Provider   amoxicillin-clavulanate (AUGMENTIN) 875-125 MG tablet Take 1 tablet by mouth every 12 (twelve) hours. 20 tablet Jenson Beedle K, PA-C   naproxen (NAPROSYN) 500 MG tablet Take 1 tablet (500 mg total) by mouth 2 (two) times daily with a meal. 10 tablet Makari Portman K, PA-C      PDMP not reviewed this encounter.   Terrilee Croak, PA-C 11/10/21 1652

## 2021-11-10 NOTE — Discharge Instructions (Addendum)
It appears you have a swollen lymph node.  I will contact you with your lab work if anything is abnormal.  We gave you an antibiotic of antibiotics and I want you to stop amoxicillin and start Augmentin (amoxicillin/clavulanic acid).  It is very important that you follow-up with ENT as soon as possible.  Please call to schedule appointment them first thing tomorrow.  If you have any worsening symptoms including enlarging lesion, difficulty swallowing, shortness of breath, fever, nausea/vomiting you need to go to the emergency room as we discussed.

## 2021-11-10 NOTE — ED Triage Notes (Addendum)
Virtual visit on Friday and was prescribed amoxicillin and patient thinks swelling is getting worse and is more sore.  Provider for virtual visit recommended that is symptoms worsened to have someone look at him.    Reports swelling to left jaw is 3 times the size as it was friday

## 2022-02-07 ENCOUNTER — Ambulatory Visit (HOSPITAL_COMMUNITY)
Admission: EM | Admit: 2022-02-07 | Discharge: 2022-02-07 | Disposition: A | Discharge status: Home / Self Care | Payer: No Payment, Other | Attending: Behavioral Health | Admitting: Behavioral Health

## 2022-02-07 DIAGNOSIS — F4322 Adjustment disorder with anxiety: Secondary | ICD-10-CM | POA: Insufficient documentation
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
## 2022-02-07 NOTE — BH Assessment (Signed)
Patient is a 61 year old male that presents as a voluntary walk in to [facility] requesting assistance with ongoing stress from his niece recently moving in with him and his wife which has been difficult for him. He also would like resources for ongoing alcohol issues and possible be evaluated for medication management for sleep. Patient reports he "only drinks on weekends" 6 to 7 beers although his wife is upset he is leving their home to drink with a friend. Patient denies any S/I, H/I or AVH.

## 2022-02-07 NOTE — Discharge Instructions (Addendum)
Discharge recommendations:   Please see information for follow-up appointment with psychiatry and therapy.  Please follow up with your primary care provider for all medical related needs.   Therapy: We recommend that patient participate in individual therapy to address mental health concerns.  Safety:  The patient should abstain from use of illicit substances/drugs and abuse of any medications. If symptoms worsen or do not continue to improve or if the patient becomes actively suicidal or homicidal then it is recommended that the patient return to the closest hospital emergency department, the [facility], or call 911 for further evaluation and treatment. National Suicide Prevention Lifeline 1-800-SUICIDE or 908-139-4468.  About 988 988 offers 24/7 access to trained crisis counselors who can help people experiencing mental health-related distress. People can call or text 988 or chat 988lifeline.org for themselves or if they are worried about a loved one who may need crisis support.

## 2022-10-05 ENCOUNTER — Other Ambulatory Visit: Payer: Self-pay

## 2022-10-05 ENCOUNTER — Emergency Department (HOSPITAL_COMMUNITY)
Admission: EM | Admit: 2022-10-05 | Discharge: 2022-10-05 | Disposition: A | Discharge status: Home / Self Care | Payer: BC Managed Care – PPO | Attending: Emergency Medicine | Admitting: Emergency Medicine

## 2022-10-05 ENCOUNTER — Encounter (HOSPITAL_COMMUNITY): Payer: Self-pay | Admitting: Emergency Medicine

## 2022-10-05 DIAGNOSIS — T148XXA Other injury of unspecified body region, initial encounter: Secondary | ICD-10-CM

## 2022-10-05 DIAGNOSIS — S60411A Abrasion of left index finger, initial encounter: Secondary | ICD-10-CM | POA: Insufficient documentation

## 2022-10-05 DIAGNOSIS — K0889 Other specified disorders of teeth and supporting structures: Secondary | ICD-10-CM | POA: Insufficient documentation

## 2022-10-05 DIAGNOSIS — Z23 Encounter for immunization: Secondary | ICD-10-CM | POA: Insufficient documentation

## 2022-10-05 DIAGNOSIS — X58XXXA Exposure to other specified factors, initial encounter: Secondary | ICD-10-CM | POA: Insufficient documentation

## 2022-10-05 MED ORDER — TETANUS-DIPHTH-ACELL PERTUSSIS 5-2.5-18.5 LF-MCG/0.5 IM SUSY
0.5000 mL | PREFILLED_SYRINGE | Freq: Once | INTRAMUSCULAR | Status: AC
  Administered 2022-10-05: 0.5 mL via INTRAMUSCULAR
  Filled 2022-10-05: qty 0.5

## 2022-10-05 MED ORDER — AMOXICILLIN-POT CLAVULANATE 875-125 MG PO TABS: 1.0000 | ORAL_TABLET | Freq: Two times a day (BID) | ORAL | 0 refills | Status: AC

## 2022-10-05 MED ORDER — IBUPROFEN 400 MG PO TABS
600.0000 mg | ORAL_TABLET | Freq: Once | ORAL | Status: AC
  Administered 2022-10-05: 600 mg via ORAL
  Filled 2022-10-05: qty 1

## 2022-10-05 MED ORDER — AMOXICILLIN-POT CLAVULANATE 875-125 MG PO TABS
1.0000 | ORAL_TABLET | Freq: Once | ORAL | Status: AC
  Administered 2022-10-05: 1 via ORAL
  Filled 2022-10-05: qty 1

## 2022-10-05 NOTE — ED Provider Notes (Signed)
[place] EMERGENCY DEPARTMENT AT [facility] Provider Note   CSN: [id number] Arrival date & time: 10/05/22  1422     History  Chief Complaint  Patient presents with   Facial Pain    Daniel Stevens is a 62 y.o. male.  62 year old male with prior medical history as detailed below presents for evaluation.  Patient with 2 complaints.  Primary complaint is of left sided lower jaw dental pain.  Patient has a partial in this place.  Complains of increasing pain to the left lower jaw over the last 24 hours.  He denies fever.  He reports painful lymph node underneath the angle of the left jaw.  He is speaking in full sentences.  He is swallowing his own secretions.  He also complains of small abrasion to the left index finger.  This occurred yesterday evening while he was working on a car.  After he scraped his finger he dressed the wound with electrical tape and some paper towel.  He is unsure of his last tetanus.    The history is provided by the patient and medical records.       Home Medications Prior to Admission medications   Medication Sig Start Date End Date Taking? Authorizing Provider  acetaminophen-codeine (TYLENOL #3) 300-30 MG tablet Take 1 tablet by mouth every 4 (four) hours as needed for moderate pain. 06/08/21   Tysinger, Kermit Balo, PA-C  amoxicillin-clavulanate (AUGMENTIN) 875-125 MG tablet Take 1 tablet by mouth every 12 (twelve) hours. 11/10/21   Raspet, Noberto Retort, PA-C  dapagliflozin propanediol (FARXIGA) 5 MG TABS tablet Take 1 tablet (5 mg total) by mouth daily before breakfast. 04/15/21   Tysinger, Kermit Balo, PA-C  metFORMIN (GLUCOPHAGE) 850 MG tablet Take 1 tablet (850 mg total) by mouth 2 (two) times daily with a meal. 04/14/21 04/14/22  Tysinger, Kermit Balo, PA-C  naproxen (NAPROSYN) 500 MG tablet Take 1 tablet (500 mg total) by mouth 2 (two) times daily with a meal. 11/10/21   Raspet, Erin K, PA-C  rosuvastatin (CRESTOR) 10 MG tablet Take 1 tablet (10 mg total)  by mouth daily. Patient not taking: Reported on 11/10/2021 04/14/21 04/14/22  Tysinger, Kermit Balo, PA-C      Allergies    Promethazine    Review of Systems   Review of Systems  All other systems reviewed and are negative.   Physical Exam Updated Vital Signs BP (!) 164/100 (BP Location: Right Arm)   Pulse (!) 103   Temp 98.8 F (37.1 C) (Oral)   Resp 18   SpO2 96%  Physical Exam Vitals and nursing note reviewed.  Constitutional:      General: He is not in acute distress.    Appearance: Normal appearance. He is well-developed.  HENT:     Head: Normocephalic and atraumatic.     Mouth/Throat:     Comments: Poor dentition through out  No appreciable dental abscess  Mild left sided submandibular lymphoadenopathy present  Normal phonation, handling own secretions Eyes:     Conjunctiva/sclera: Conjunctivae normal.     Pupils: Pupils are equal, round, and reactive to light.  Cardiovascular:     Rate and Rhythm: Normal rate and regular rhythm.     Heart sounds: Normal heart sounds.  Pulmonary:     Effort: Pulmonary effort is normal. No respiratory distress.     Breath sounds: Normal breath sounds.  Abdominal:     General: There is no distension.     Palpations: Abdomen is soft.  Tenderness: There is no abdominal tenderness.  Musculoskeletal:        General: No deformity. Normal range of motion.     Cervical back: Normal range of motion and neck supple.  Skin:    General: Skin is warm and dry.     Comments: Superficial abrasion to left index finger - no active bleeding, distal index finger is NVI  Neurological:     General: No focal deficit present.     Mental Status: He is alert and oriented to person, place, and time.     ED Results / Procedures / Treatments   Labs (all labs ordered are listed, but only abnormal results are displayed) Labs Reviewed - No data to display  EKG None  Radiology No results found.  Procedures Procedures    Medications  Ordered in ED Medications  Tdap (BOOSTRIX) injection 0.5 mL (has no administration in time range)  amoxicillin-clavulanate (AUGMENTIN) 875-125 MG per tablet 1 tablet (has no administration in time range)    ED Course/ Medical Decision Making/ A&P                             Medical Decision Making Risk Prescription drug management.    Medical Screen Complete  This patient presented to the ED with complaint of dental pain, finger abrasion.  This complaint involves an extensive number of treatment options. The initial differential diagnosis includes, but is not limited to, dental infection, finger abrasion  This presentation is: Acute, Chronic, Self-Limited, Previously Undiagnosed, Uncertain Prognosis, Complicated, Systemic Symptoms, and Threat to Life/Bodily Function  Patient with known history of dental decay presents with complaint of left-sided lower dental pain.  Described symptoms are consistent with likely early dental infection.  No reported fever.  Normal phonation.  Patient is handling his own secretions without difficulty.  Patient also complains of superficial abrasion to the left index finger.  Tetanus updated.  Wound dressing applied.  Patient would benefit from course of antibiotics to treat dental infection primarily.  Patient does have established outpatient dentistry.  He understands need for close outpatient follow-up with same.  Strict return precautions given and understood.  Additional history obtained:  External records from outside sources obtained and reviewed including prior ED visits and prior Inpatient records.    Problem List / ED Course:  Dental pain   Reevaluation:  After the interventions noted above, I reevaluated the patient and found that they have: improved  Disposition:  After consideration of the diagnostic results and the patients response to treatment, I feel that the patent would benefit from close outpatient followup.           Final Clinical Impression(s) / ED Diagnoses Final diagnoses:  Pain, dental  Abrasion    Rx / DC Orders ED Discharge Orders     None         Wynetta Fines, MD 10/05/22 1539

## 2022-10-05 NOTE — Discharge Instructions (Signed)
Return for any problem.  ?

## 2022-10-05 NOTE — ED Triage Notes (Signed)
Pt reports left sided neck, mouth, and facial pain and swelling. Pt denies fevers.

## 2022-10-05 NOTE — ED Notes (Signed)
Right lower facial swelling. No pharyngeal swelling noted. Respirations even and unlabored. Patient able to control oral secretions. NAD.

## 2022-10-05 NOTE — ED Notes (Signed)
Finger cleaned and dressed.

## 2024-04-23 ENCOUNTER — Telehealth (INDEPENDENT_AMBULATORY_CARE_PROVIDER_SITE_OTHER): Payer: Self-pay | Admitting: Primary Care

## 2024-04-23 NOTE — Telephone Encounter (Signed)
 Called pt to remind them about upcoming appt. Pt remind pt if call returned.

## 2024-04-24 ENCOUNTER — Ambulatory Visit (INDEPENDENT_AMBULATORY_CARE_PROVIDER_SITE_OTHER): Payer: Medicare (Managed Care) | Admitting: Primary Care

## 2024-07-04 NOTE — Progress Notes (Unsigned)
 "  Office Visit Note  Patient: Daniel Stevens             Date of Birth: 06-22-1960           MRN: [id number]             PCP: Pcp, No Referring: Maree Leni Hill, MD Visit Date: 07/18/2024 Occupation: Data Unavailable  Subjective:  No chief complaint on file.   History of Present Illness: Daniel Stevens is a 64 y.o. male ***     Activities of Daily Living:  Patient reports morning stiffness for *** {minute/hour:19697}.   Patient {ACTIONS;DENIES/REPORTS:21021675::Denies} nocturnal pain.  Difficulty dressing/grooming: {ACTIONS;DENIES/REPORTS:21021675::Denies} Difficulty climbing stairs: {ACTIONS;DENIES/REPORTS:21021675::Denies} Difficulty getting out of chair: {ACTIONS;DENIES/REPORTS:21021675::Denies} Difficulty using hands for taps, buttons, cutlery, and/or writing: {ACTIONS;DENIES/REPORTS:21021675::Denies}  No Rheumatology ROS completed.   PMFS History:  Patient Active Problem List   Diagnosis Date Noted   Chest wall pain 01/28/2021   Noncompliance 01/28/2021   Perirectal cellulitis 06/23/2020   Pilonidal cyst 06/23/2020   Rectal pain 06/23/2020   Right leg pain 03/24/2020   Right hip pain 03/24/2020   Acute otitis media 03/24/2020   Dizziness 03/24/2020   Perforated ear drum, right 03/24/2020   Hx of adenomatous polyp of colon 01/21/2020   Encounter for health maintenance examination in adult 12/04/2019   Screening for prostate cancer 12/04/2019   Former smoker 12/04/2019   Vaccine counseling 12/04/2019   Need for Tdap vaccination 12/04/2019   Acute pain of left shoulder 12/04/2019   Acute pain of right shoulder 12/04/2019   Sleep disturbance 12/18/2018   Snoring 12/18/2018   BMI 29.0-29.9,adult 12/18/2018   Chronic bilateral low back pain 07/25/2018   Bilateral hip pain 07/25/2018   Chronic gout without tophus 07/25/2018   Alcohol abuse 07/25/2018   Type 2 diabetes mellitus with complication, without long-term current use of insulin (HCC) 01/18/2018    Hyperlipidemia 01/18/2018   Need for influenza vaccination 01/18/2018   Lipoma of anterior chest wall 03/09/2016    Past Medical History:  Diagnosis Date   Diabetes mellitus without complication (HCC) 02/2016   Family history of malignant hyperthermia    pt father passed away from malignant hyperthermia   Former smoker    quit 2016. smoked on and off for years, would quit and start back.  5 pack year hx/o as of 02/2016   Gout    History of alcohol abuse    Hx of adenomatous polyp of colon 01/21/2020   Hyperlipidemia 02/2016   Obesity    Sleep apnea 2020   12/25/19-not using cpap yet   Substance abuse (HCC)     Family History  Problem Relation Age of Onset   Diabetes Mother    Arthritis Mother    Hypertension Mother    Cancer Mother        ovarian   Other Father        died due to medication error/anesthesia   Asthma Brother    Asthma Brother    Diabetes Brother    Stroke Neg Hx    Heart disease Neg Hx    Rectal cancer Neg Hx    Stomach cancer Neg Hx    Esophageal cancer Neg Hx    Colon cancer Neg Hx    Colon polyps Neg Hx    Past Surgical History:  Procedure Laterality Date   COLONOSCOPY     pending 12/2019   UMBILICAL HERNIA REPAIR     remote past   Social History[1] Social History  Social History Narrative   Presenter, broadcasting driving, market researcher, plumbing and heating.  Married.  Has 3 children, 2 girls, 1 boy.   As of 11/2019     Immunization History  Administered Date(s) Administered   Influenza,inj,Quad PF,6+ Mos 03/09/2016, 01/18/2018, 03/16/2019   Influenza-Unspecified 03/20/2015   Tdap 10/05/2022     Objective: Vital Signs: There were no vitals taken for this visit.   Physical Exam   Musculoskeletal Exam: ***  CDAI Exam: CDAI Score: -- Patient Global: --; Provider Global: -- Swollen: --; Tender: -- Joint Exam 07/18/2024   No joint exam has been documented for this visit   There is currently no information documented on the homunculus. Go to  the Rheumatology activity and complete the homunculus joint exam.  Investigation: No additional findings.  Imaging: No results found.  Recent Labs: Lab Results  Component Value Date   WBC 8.1 11/10/2021   HGB 14.0 11/10/2021   PLT 205 11/10/2021   NA 139 11/10/2021   K 4.0 11/10/2021   CL 106 11/10/2021   CO2 24 11/10/2021   GLUCOSE 205 (H) 11/10/2021   BUN 18 11/10/2021   CREATININE 0.82 11/10/2021   BILITOT 0.5 11/10/2021   ALKPHOS 51 11/10/2021   AST 13 (L) 11/10/2021   ALT 15 11/10/2021   PROT 7.1 11/10/2021   ALBUMIN 4.0 11/10/2021   CALCIUM  9.4 11/10/2021   GFRAA 106 12/04/2019    Speciality Comments: No specialty comments available.  Procedures:  No procedures performed Allergies: Promethazine    Assessment / Plan:     Visit Diagnoses: Positive ANA (antinuclear antibody)  Rheumatoid factor positive  Polyarthralgia  Primary osteoarthritis of right hip  Type 2 diabetes mellitus with complication, without long-term current use of insulin (HCC)  Perirectal cellulitis  Pilonidal cyst  Hx of adenomatous polyp of colon  Former smoker  History of gout  History of hyperlipidemia  Essential hypertension  Orders: No orders of the defined types were placed in this encounter.  No orders of the defined types were placed in this encounter.   Face-to-face time spent with patient was *** minutes. Greater than 50% of time was spent in counseling and coordination of care.  Follow-Up Instructions: No follow-ups on file.   Waddell CHRISTELLA Craze, PA-C  Note - This record has been created using Dragon software.  Chart creation errors have been sought, but may not always  have been located. Such creation errors do not reflect on  the standard of medical care.     [1]  Social History Tobacco Use   Smoking status: Former    Current packs/day: 0.00    Types: Cigarettes    Quit date: 06/22/2013    Years since quitting: 11.0   Smokeless tobacco: Never   Vaping Use   Vaping status: Never Used  Substance Use Topics   Alcohol use: Not Currently    Comment: quit in august 2019   Drug use: No   "

## 2024-07-18 ENCOUNTER — Ambulatory Visit: Payer: Medicare (Managed Care) | Admitting: Physician Assistant

## 2024-07-18 DIAGNOSIS — Z860101 Personal history of adenomatous and serrated colon polyps: Secondary | ICD-10-CM

## 2024-07-18 DIAGNOSIS — Z8639 Personal history of other endocrine, nutritional and metabolic disease: Secondary | ICD-10-CM

## 2024-07-18 DIAGNOSIS — R011 Cardiac murmur, unspecified: Secondary | ICD-10-CM

## 2024-07-18 DIAGNOSIS — Z87891 Personal history of nicotine dependence: Secondary | ICD-10-CM

## 2024-07-18 DIAGNOSIS — I10 Essential (primary) hypertension: Secondary | ICD-10-CM

## 2024-07-18 DIAGNOSIS — E118 Type 2 diabetes mellitus with unspecified complications: Secondary | ICD-10-CM

## 2024-07-18 DIAGNOSIS — M2550 Pain in unspecified joint: Secondary | ICD-10-CM

## 2024-07-18 DIAGNOSIS — K611 Rectal abscess: Secondary | ICD-10-CM

## 2024-07-18 DIAGNOSIS — R7689 Other specified abnormal immunological findings in serum: Secondary | ICD-10-CM

## 2024-07-18 DIAGNOSIS — E559 Vitamin D deficiency, unspecified: Secondary | ICD-10-CM

## 2024-07-18 DIAGNOSIS — L0591 Pilonidal cyst without abscess: Secondary | ICD-10-CM

## 2024-07-18 DIAGNOSIS — M1611 Unilateral primary osteoarthritis, right hip: Secondary | ICD-10-CM

## 2024-07-18 DIAGNOSIS — Z8739 Personal history of other diseases of the musculoskeletal system and connective tissue: Secondary | ICD-10-CM
